# Patient Record
Sex: Female | Born: 1945 | Race: White | Hispanic: No | Marital: Married | Smoking: Never smoker
Health system: Southern US, Community
[De-identification: ages and names within clinical notes are randomized; demographics above are authoritative.]

## PROBLEM LIST (undated history)

## (undated) DIAGNOSIS — C50412 Malignant neoplasm of upper-outer quadrant of left female breast: Secondary | ICD-10-CM

## (undated) DIAGNOSIS — R011 Cardiac murmur, unspecified: Secondary | ICD-10-CM

## (undated) DIAGNOSIS — I10 Essential (primary) hypertension: Secondary | ICD-10-CM

## (undated) DIAGNOSIS — D499 Neoplasm of unspecified behavior of unspecified site: Secondary | ICD-10-CM

## (undated) DIAGNOSIS — C50919 Malignant neoplasm of unspecified site of unspecified female breast: Secondary | ICD-10-CM

## (undated) DIAGNOSIS — R002 Palpitations: Secondary | ICD-10-CM

## (undated) DIAGNOSIS — Z170 Estrogen receptor positive status [ER+]: Secondary | ICD-10-CM

## (undated) HISTORY — PX: CHOLECYSTECTOMY: SHX55

## (undated) HISTORY — DX: Palpitations: R00.2

## (undated) HISTORY — DX: Estrogen receptor positive status (ER+): Z17.0

## (undated) HISTORY — DX: Neoplasm of unspecified behavior of unspecified site: D49.9

## (undated) HISTORY — DX: Malignant neoplasm of unspecified site of unspecified female breast: C50.919

## (undated) HISTORY — PX: TONSILLECTOMY: SUR1361

## (undated) HISTORY — PX: ABDOMINAL HYSTERECTOMY: SHX81

## (undated) HISTORY — DX: Essential (primary) hypertension: I10

## (undated) HISTORY — PX: THYROID SURGERY: SHX805

## (undated) HISTORY — PX: APPENDECTOMY: SHX54

## (undated) HISTORY — DX: Malignant neoplasm of upper-outer quadrant of left female breast: C50.412

## (undated) HISTORY — DX: Cardiac murmur, unspecified: R01.1

---

## 1999-09-22 ENCOUNTER — Encounter: Payer: Self-pay | Admitting: General Surgery

## 1999-09-22 ENCOUNTER — Encounter: Admission: RE | Admit: 1999-09-22 | Discharge: 1999-09-22 | Payer: Self-pay | Admitting: General Surgery

## 2000-11-01 ENCOUNTER — Encounter: Payer: Self-pay | Admitting: General Surgery

## 2000-11-01 ENCOUNTER — Encounter: Admission: RE | Admit: 2000-11-01 | Discharge: 2000-11-01 | Payer: Self-pay | Admitting: General Surgery

## 2002-11-03 ENCOUNTER — Encounter: Admission: RE | Admit: 2002-11-03 | Discharge: 2002-11-03 | Payer: Self-pay | Admitting: Family Medicine

## 2002-11-03 ENCOUNTER — Encounter: Payer: Self-pay | Admitting: *Deleted

## 2004-11-12 ENCOUNTER — Encounter: Admission: RE | Admit: 2004-11-12 | Discharge: 2004-11-12 | Payer: Self-pay | Admitting: Family Medicine

## 2005-12-29 ENCOUNTER — Encounter: Admission: RE | Admit: 2005-12-29 | Discharge: 2005-12-29 | Payer: Self-pay | Admitting: Family Medicine

## 2008-08-16 ENCOUNTER — Encounter: Admission: RE | Admit: 2008-08-16 | Discharge: 2008-08-16 | Payer: Self-pay | Admitting: Family Medicine

## 2009-08-22 ENCOUNTER — Encounter: Admission: RE | Admit: 2009-08-22 | Discharge: 2009-08-22 | Payer: Self-pay | Admitting: Family Medicine

## 2009-12-26 ENCOUNTER — Ambulatory Visit (HOSPITAL_BASED_OUTPATIENT_CLINIC_OR_DEPARTMENT_OTHER): Admission: RE | Admit: 2009-12-26 | Discharge: 2009-12-26 | Payer: Self-pay | Admitting: Orthopedic Surgery

## 2010-08-07 ENCOUNTER — Other Ambulatory Visit: Payer: Self-pay | Admitting: Family Medicine

## 2010-08-07 DIAGNOSIS — Z1231 Encounter for screening mammogram for malignant neoplasm of breast: Secondary | ICD-10-CM

## 2010-08-23 LAB — BASIC METABOLIC PANEL
Calcium: 9.2 mg/dL (ref 8.4–10.5)
Creatinine, Ser: 0.88 mg/dL (ref 0.4–1.2)
GFR calc Af Amer: >60 mL/min (ref >60)
GFR calc non Af Amer: >60 mL/min (ref >60)
Glucose, Bld: 122 mg/dL — ABNORMAL HIGH (ref 70–99)
Sodium: 135 mEq/L (ref 135–145)

## 2010-08-28 ENCOUNTER — Ambulatory Visit: Payer: Self-pay

## 2010-09-11 ENCOUNTER — Ambulatory Visit
Admission: RE | Admit: 2010-09-11 | Discharge: 2010-09-11 | Disposition: A | Discharge status: Home / Self Care | Payer: Medicare Other | Source: Ambulatory Visit | Attending: Family Medicine | Admitting: Family Medicine

## 2010-09-11 DIAGNOSIS — Z1231 Encounter for screening mammogram for malignant neoplasm of breast: Secondary | ICD-10-CM

## 2011-07-06 DIAGNOSIS — L259 Unspecified contact dermatitis, unspecified cause: Secondary | ICD-10-CM | POA: Diagnosis not present

## 2011-09-10 ENCOUNTER — Other Ambulatory Visit: Payer: Self-pay | Admitting: Family Medicine

## 2011-09-10 DIAGNOSIS — H43819 Vitreous degeneration, unspecified eye: Secondary | ICD-10-CM | POA: Diagnosis not present

## 2011-09-10 DIAGNOSIS — H2510 Age-related nuclear cataract, unspecified eye: Secondary | ICD-10-CM | POA: Diagnosis not present

## 2011-09-10 DIAGNOSIS — Z1231 Encounter for screening mammogram for malignant neoplasm of breast: Secondary | ICD-10-CM

## 2011-09-10 DIAGNOSIS — H35039 Hypertensive retinopathy, unspecified eye: Secondary | ICD-10-CM | POA: Diagnosis not present

## 2011-09-10 DIAGNOSIS — H40019 Open angle with borderline findings, low risk, unspecified eye: Secondary | ICD-10-CM | POA: Diagnosis not present

## 2011-09-10 DIAGNOSIS — H04129 Dry eye syndrome of unspecified lacrimal gland: Secondary | ICD-10-CM | POA: Diagnosis not present

## 2011-10-02 ENCOUNTER — Ambulatory Visit: Payer: BC Managed Care – PPO

## 2011-10-16 ENCOUNTER — Ambulatory Visit
Admission: RE | Admit: 2011-10-16 | Discharge: 2011-10-16 | Disposition: A | Discharge status: Home / Self Care | Payer: Medicare Other | Source: Ambulatory Visit | Attending: Family Medicine | Admitting: Family Medicine

## 2011-10-16 DIAGNOSIS — Z1231 Encounter for screening mammogram for malignant neoplasm of breast: Secondary | ICD-10-CM | POA: Diagnosis not present

## 2011-11-17 DIAGNOSIS — E559 Vitamin D deficiency, unspecified: Secondary | ICD-10-CM | POA: Diagnosis not present

## 2011-11-17 DIAGNOSIS — E039 Hypothyroidism, unspecified: Secondary | ICD-10-CM | POA: Diagnosis not present

## 2011-11-17 DIAGNOSIS — E7800 Pure hypercholesterolemia, unspecified: Secondary | ICD-10-CM | POA: Diagnosis not present

## 2011-11-17 DIAGNOSIS — I10 Essential (primary) hypertension: Secondary | ICD-10-CM | POA: Diagnosis not present

## 2011-11-17 DIAGNOSIS — E119 Type 2 diabetes mellitus without complications: Secondary | ICD-10-CM | POA: Diagnosis not present

## 2011-12-24 DIAGNOSIS — E782 Mixed hyperlipidemia: Secondary | ICD-10-CM | POA: Diagnosis not present

## 2012-01-01 DIAGNOSIS — J309 Allergic rhinitis, unspecified: Secondary | ICD-10-CM | POA: Diagnosis not present

## 2012-01-01 DIAGNOSIS — J040 Acute laryngitis: Secondary | ICD-10-CM | POA: Diagnosis not present

## 2012-01-01 DIAGNOSIS — J209 Acute bronchitis, unspecified: Secondary | ICD-10-CM | POA: Diagnosis not present

## 2012-03-01 DIAGNOSIS — R05 Cough: Secondary | ICD-10-CM | POA: Diagnosis not present

## 2012-03-01 DIAGNOSIS — R1012 Left upper quadrant pain: Secondary | ICD-10-CM | POA: Diagnosis not present

## 2012-03-01 DIAGNOSIS — R1013 Epigastric pain: Secondary | ICD-10-CM | POA: Diagnosis not present

## 2012-03-01 DIAGNOSIS — N3000 Acute cystitis without hematuria: Secondary | ICD-10-CM | POA: Diagnosis not present

## 2012-03-21 DIAGNOSIS — Z23 Encounter for immunization: Secondary | ICD-10-CM | POA: Diagnosis not present

## 2012-04-12 DIAGNOSIS — K589 Irritable bowel syndrome without diarrhea: Secondary | ICD-10-CM | POA: Diagnosis not present

## 2012-04-12 DIAGNOSIS — Z86010 Personal history of colonic polyps: Secondary | ICD-10-CM | POA: Diagnosis not present

## 2012-04-12 DIAGNOSIS — Z1211 Encounter for screening for malignant neoplasm of colon: Secondary | ICD-10-CM | POA: Diagnosis not present

## 2012-05-02 DIAGNOSIS — I10 Essential (primary) hypertension: Secondary | ICD-10-CM | POA: Diagnosis not present

## 2012-05-02 DIAGNOSIS — E7800 Pure hypercholesterolemia, unspecified: Secondary | ICD-10-CM | POA: Diagnosis not present

## 2012-05-02 DIAGNOSIS — D126 Benign neoplasm of colon, unspecified: Secondary | ICD-10-CM | POA: Diagnosis not present

## 2012-05-02 DIAGNOSIS — R5381 Other malaise: Secondary | ICD-10-CM | POA: Diagnosis not present

## 2012-05-02 DIAGNOSIS — R197 Diarrhea, unspecified: Secondary | ICD-10-CM | POA: Diagnosis not present

## 2012-05-02 DIAGNOSIS — R404 Transient alteration of awareness: Secondary | ICD-10-CM | POA: Diagnosis not present

## 2012-05-02 DIAGNOSIS — R112 Nausea with vomiting, unspecified: Secondary | ICD-10-CM | POA: Diagnosis not present

## 2012-05-02 DIAGNOSIS — R1084 Generalized abdominal pain: Secondary | ICD-10-CM | POA: Diagnosis not present

## 2012-05-02 DIAGNOSIS — Z86010 Personal history of colonic polyps: Secondary | ICD-10-CM | POA: Diagnosis not present

## 2012-05-02 DIAGNOSIS — R55 Syncope and collapse: Secondary | ICD-10-CM | POA: Diagnosis not present

## 2012-05-02 DIAGNOSIS — K648 Other hemorrhoids: Secondary | ICD-10-CM | POA: Diagnosis not present

## 2012-05-02 DIAGNOSIS — Z1211 Encounter for screening for malignant neoplasm of colon: Secondary | ICD-10-CM | POA: Diagnosis not present

## 2012-05-02 DIAGNOSIS — K5730 Diverticulosis of large intestine without perforation or abscess without bleeding: Secondary | ICD-10-CM | POA: Diagnosis not present

## 2012-05-02 DIAGNOSIS — Z79899 Other long term (current) drug therapy: Secondary | ICD-10-CM | POA: Diagnosis not present

## 2012-05-02 DIAGNOSIS — K589 Irritable bowel syndrome without diarrhea: Secondary | ICD-10-CM | POA: Diagnosis not present

## 2012-06-13 IMAGING — MG MM DIGITAL SCREENING BILAT W/ CAD
5 series · 5 of 5 positions shown · non-contrast
Comparison: none

[R CC (1 of 2)]
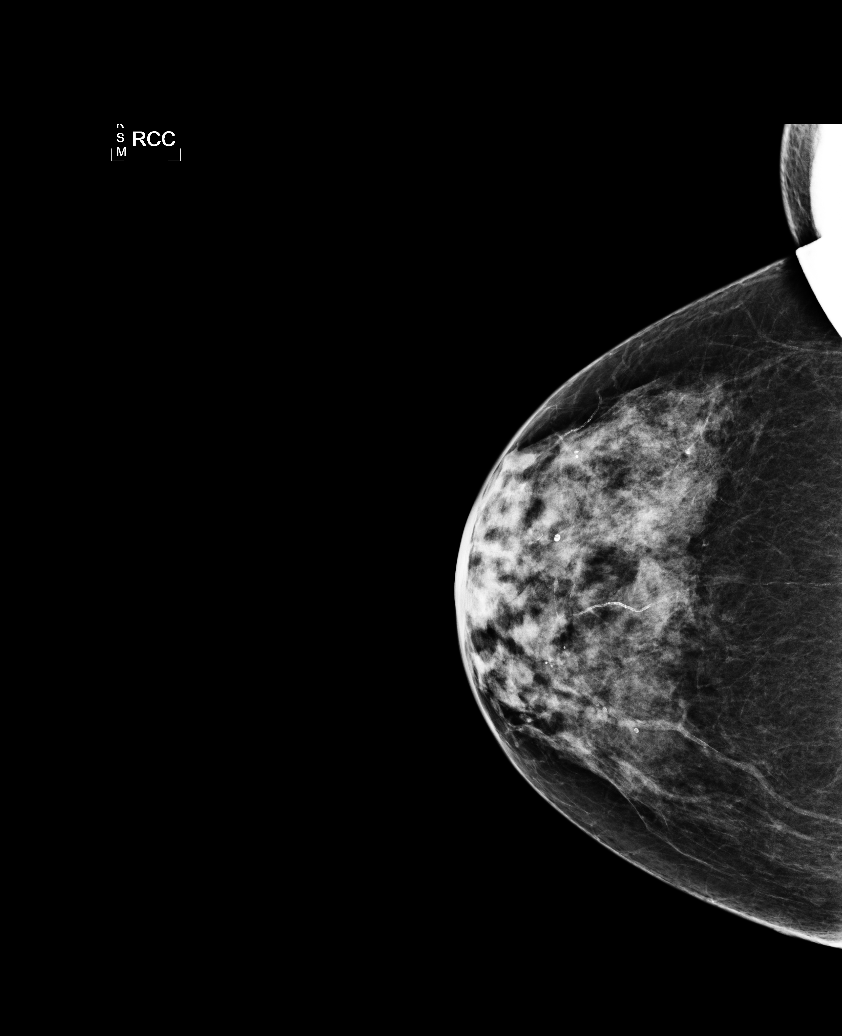

[L CC]
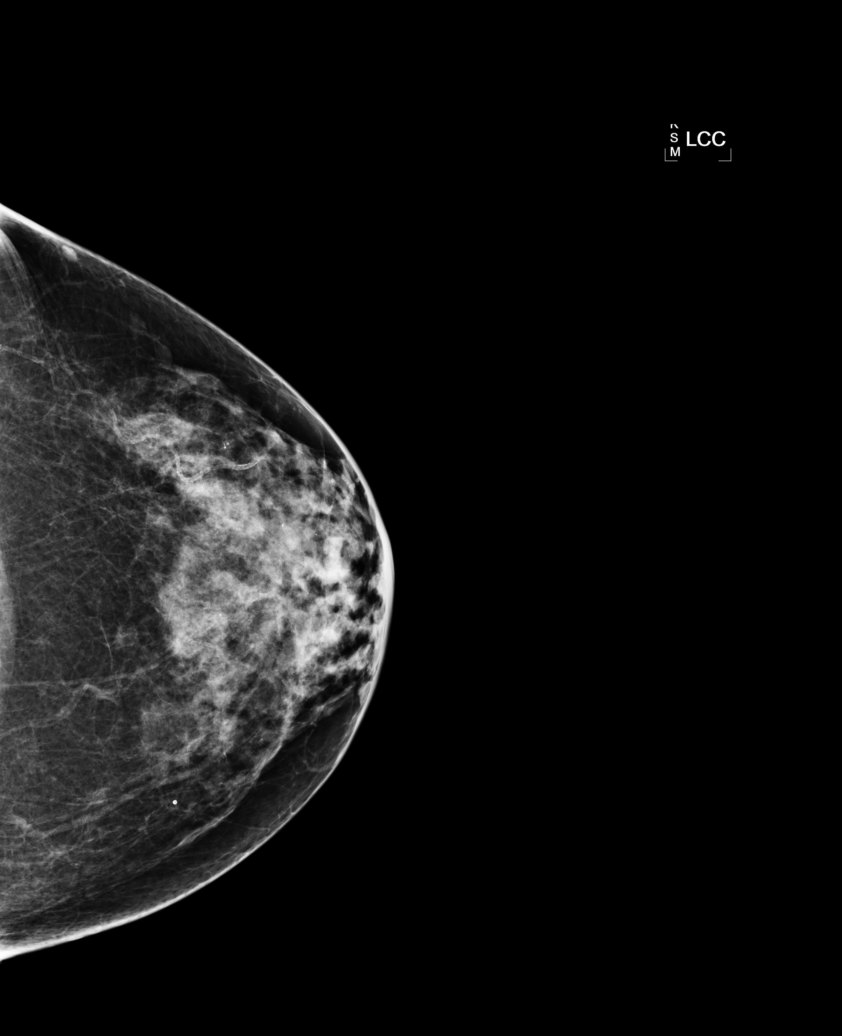

[L MLO]
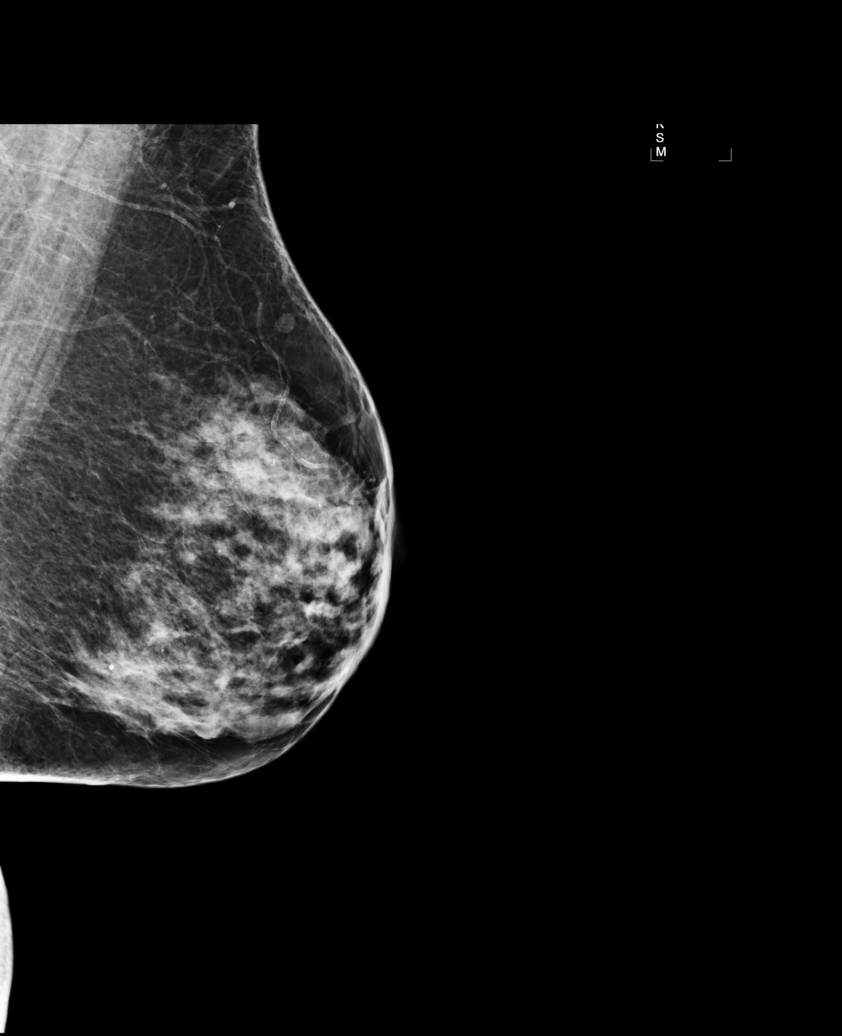

[R MLO]
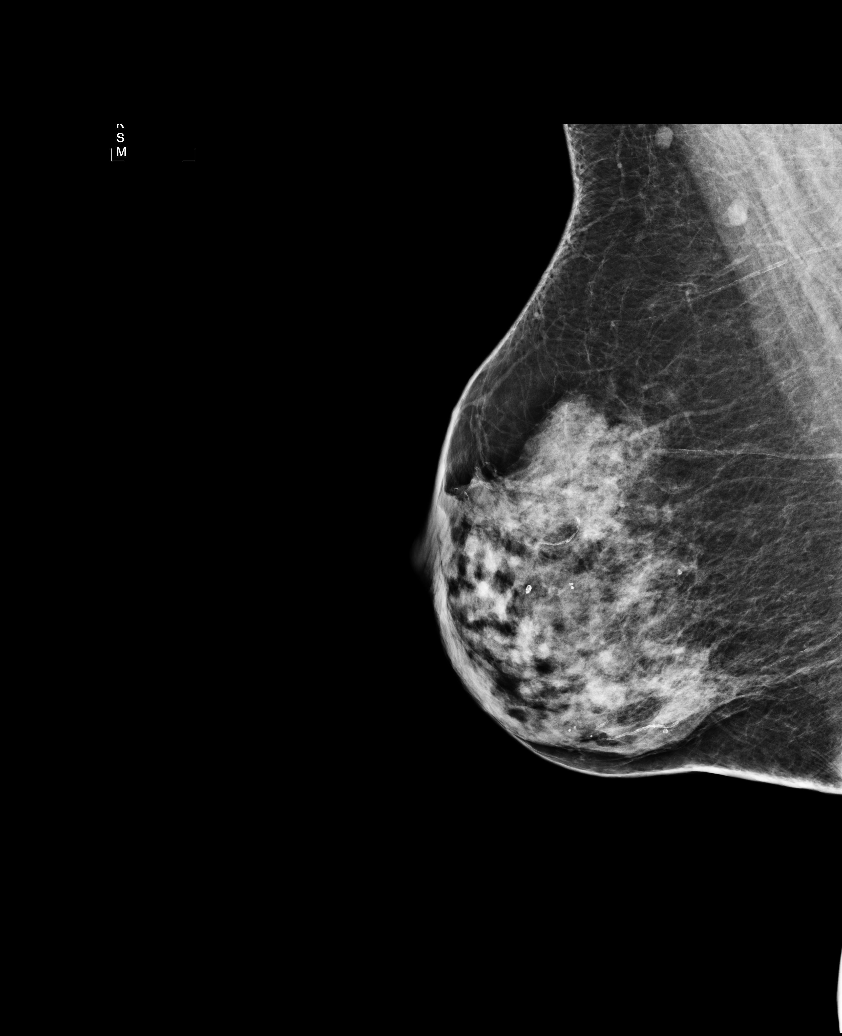

[R CC (2 of 2)]
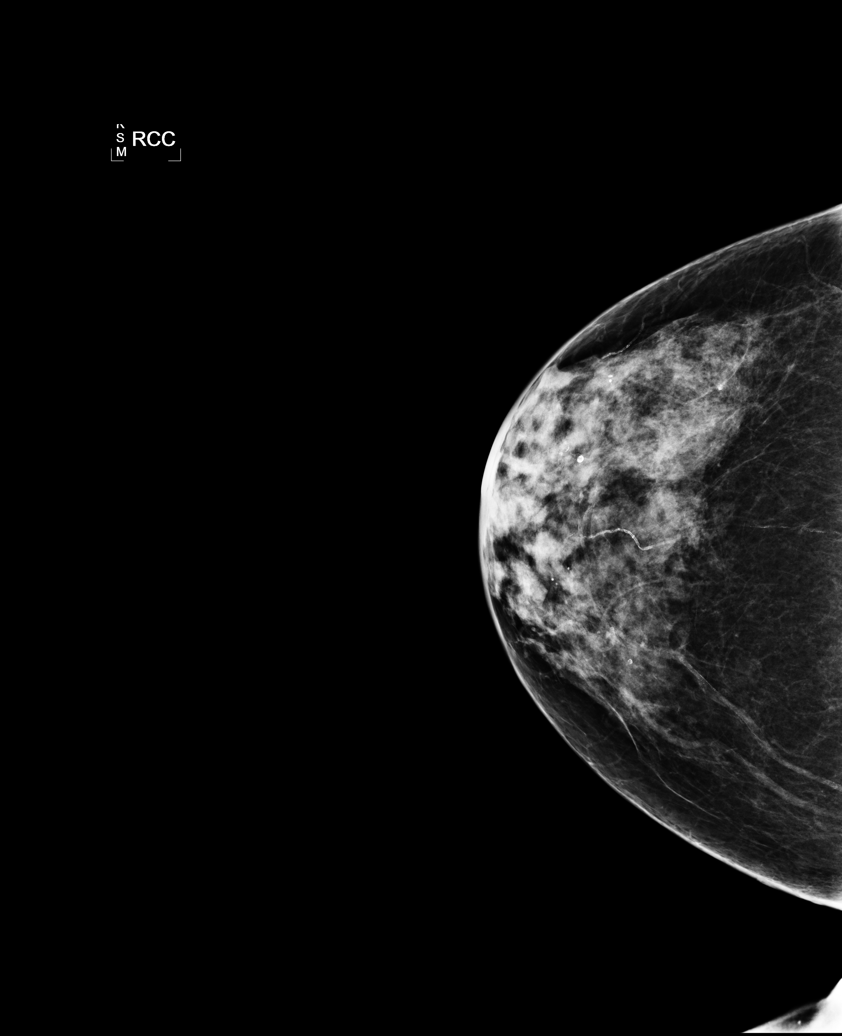

[5 of 5 positions shown; findings below may reference images not displayed]

Canned report from images found in remote index.

Refer to host system for actual result text.

## 2012-11-07 DIAGNOSIS — L578 Other skin changes due to chronic exposure to nonionizing radiation: Secondary | ICD-10-CM | POA: Diagnosis not present

## 2012-11-07 DIAGNOSIS — L820 Inflamed seborrheic keratosis: Secondary | ICD-10-CM | POA: Diagnosis not present

## 2012-11-07 DIAGNOSIS — Z85828 Personal history of other malignant neoplasm of skin: Secondary | ICD-10-CM | POA: Diagnosis not present

## 2012-11-18 DIAGNOSIS — E782 Mixed hyperlipidemia: Secondary | ICD-10-CM | POA: Diagnosis not present

## 2012-11-18 DIAGNOSIS — E119 Type 2 diabetes mellitus without complications: Secondary | ICD-10-CM | POA: Diagnosis not present

## 2012-11-18 DIAGNOSIS — Z0000 Encounter for general adult medical examination without abnormal findings: Secondary | ICD-10-CM | POA: Diagnosis not present

## 2012-11-18 DIAGNOSIS — M949 Disorder of cartilage, unspecified: Secondary | ICD-10-CM | POA: Diagnosis not present

## 2012-11-18 DIAGNOSIS — I10 Essential (primary) hypertension: Secondary | ICD-10-CM | POA: Diagnosis not present

## 2012-11-18 DIAGNOSIS — R82998 Other abnormal findings in urine: Secondary | ICD-10-CM | POA: Diagnosis not present

## 2012-11-18 DIAGNOSIS — M810 Age-related osteoporosis without current pathological fracture: Secondary | ICD-10-CM | POA: Diagnosis not present

## 2012-11-28 ENCOUNTER — Other Ambulatory Visit: Payer: Self-pay

## 2012-11-28 DIAGNOSIS — Z1231 Encounter for screening mammogram for malignant neoplasm of breast: Secondary | ICD-10-CM

## 2012-12-08 DIAGNOSIS — E782 Mixed hyperlipidemia: Secondary | ICD-10-CM | POA: Diagnosis not present

## 2012-12-08 DIAGNOSIS — I10 Essential (primary) hypertension: Secondary | ICD-10-CM | POA: Diagnosis not present

## 2012-12-08 DIAGNOSIS — E119 Type 2 diabetes mellitus without complications: Secondary | ICD-10-CM | POA: Diagnosis not present

## 2012-12-14 ENCOUNTER — Ambulatory Visit: Payer: Medicare Other

## 2012-12-16 DIAGNOSIS — L820 Inflamed seborrheic keratosis: Secondary | ICD-10-CM | POA: Diagnosis not present

## 2012-12-16 DIAGNOSIS — Z85828 Personal history of other malignant neoplasm of skin: Secondary | ICD-10-CM | POA: Diagnosis not present

## 2012-12-21 DIAGNOSIS — L821 Other seborrheic keratosis: Secondary | ICD-10-CM | POA: Diagnosis not present

## 2012-12-21 DIAGNOSIS — D2370 Other benign neoplasm of skin of unspecified lower limb, including hip: Secondary | ICD-10-CM | POA: Diagnosis not present

## 2012-12-21 DIAGNOSIS — Z85828 Personal history of other malignant neoplasm of skin: Secondary | ICD-10-CM | POA: Diagnosis not present

## 2012-12-29 ENCOUNTER — Ambulatory Visit: Payer: Medicare Other

## 2013-01-06 DIAGNOSIS — N3000 Acute cystitis without hematuria: Secondary | ICD-10-CM | POA: Diagnosis not present

## 2013-01-06 DIAGNOSIS — N899 Noninflammatory disorder of vagina, unspecified: Secondary | ICD-10-CM | POA: Diagnosis not present

## 2013-02-27 ENCOUNTER — Ambulatory Visit: Payer: Medicare Other

## 2013-03-14 DIAGNOSIS — Z23 Encounter for immunization: Secondary | ICD-10-CM | POA: Diagnosis not present

## 2013-10-25 ENCOUNTER — Ambulatory Visit: Payer: Medicare Other

## 2013-10-27 ENCOUNTER — Ambulatory Visit
Admission: RE | Admit: 2013-10-27 | Discharge: 2013-10-27 | Disposition: A | Discharge status: Home / Self Care | Payer: Medicare Other | Source: Ambulatory Visit

## 2013-10-27 DIAGNOSIS — Z1231 Encounter for screening mammogram for malignant neoplasm of breast: Secondary | ICD-10-CM

## 2013-11-22 DIAGNOSIS — Z0000 Encounter for general adult medical examination without abnormal findings: Secondary | ICD-10-CM | POA: Diagnosis not present

## 2013-11-22 DIAGNOSIS — K219 Gastro-esophageal reflux disease without esophagitis: Secondary | ICD-10-CM | POA: Diagnosis not present

## 2013-11-22 DIAGNOSIS — E559 Vitamin D deficiency, unspecified: Secondary | ICD-10-CM | POA: Diagnosis not present

## 2013-11-22 DIAGNOSIS — I10 Essential (primary) hypertension: Secondary | ICD-10-CM | POA: Diagnosis not present

## 2013-11-22 DIAGNOSIS — E119 Type 2 diabetes mellitus without complications: Secondary | ICD-10-CM | POA: Diagnosis not present

## 2013-11-22 DIAGNOSIS — E782 Mixed hyperlipidemia: Secondary | ICD-10-CM | POA: Diagnosis not present

## 2013-11-22 DIAGNOSIS — E039 Hypothyroidism, unspecified: Secondary | ICD-10-CM | POA: Diagnosis not present

## 2013-11-28 DIAGNOSIS — Z124 Encounter for screening for malignant neoplasm of cervix: Secondary | ICD-10-CM | POA: Diagnosis not present

## 2013-12-18 DIAGNOSIS — E119 Type 2 diabetes mellitus without complications: Secondary | ICD-10-CM | POA: Diagnosis not present

## 2013-12-21 DIAGNOSIS — D1801 Hemangioma of skin and subcutaneous tissue: Secondary | ICD-10-CM | POA: Diagnosis not present

## 2013-12-21 DIAGNOSIS — D235 Other benign neoplasm of skin of trunk: Secondary | ICD-10-CM | POA: Diagnosis not present

## 2013-12-21 DIAGNOSIS — L259 Unspecified contact dermatitis, unspecified cause: Secondary | ICD-10-CM | POA: Diagnosis not present

## 2013-12-21 DIAGNOSIS — L821 Other seborrheic keratosis: Secondary | ICD-10-CM | POA: Diagnosis not present

## 2013-12-21 DIAGNOSIS — Z85828 Personal history of other malignant neoplasm of skin: Secondary | ICD-10-CM | POA: Diagnosis not present

## 2013-12-21 DIAGNOSIS — D1739 Benign lipomatous neoplasm of skin and subcutaneous tissue of other sites: Secondary | ICD-10-CM | POA: Diagnosis not present

## 2013-12-25 DIAGNOSIS — E119 Type 2 diabetes mellitus without complications: Secondary | ICD-10-CM | POA: Diagnosis not present

## 2014-01-25 DIAGNOSIS — E119 Type 2 diabetes mellitus without complications: Secondary | ICD-10-CM | POA: Diagnosis not present

## 2014-03-05 DIAGNOSIS — Z23 Encounter for immunization: Secondary | ICD-10-CM | POA: Diagnosis not present

## 2014-04-16 DIAGNOSIS — H01009 Unspecified blepharitis unspecified eye, unspecified eyelid: Secondary | ICD-10-CM | POA: Diagnosis not present

## 2014-05-16 DIAGNOSIS — E559 Vitamin D deficiency, unspecified: Secondary | ICD-10-CM | POA: Diagnosis not present

## 2014-05-16 DIAGNOSIS — I10 Essential (primary) hypertension: Secondary | ICD-10-CM | POA: Diagnosis not present

## 2014-05-16 DIAGNOSIS — E782 Mixed hyperlipidemia: Secondary | ICD-10-CM | POA: Diagnosis not present

## 2014-05-16 DIAGNOSIS — E1165 Type 2 diabetes mellitus with hyperglycemia: Secondary | ICD-10-CM | POA: Diagnosis not present

## 2014-05-16 DIAGNOSIS — M1990 Unspecified osteoarthritis, unspecified site: Secondary | ICD-10-CM | POA: Diagnosis not present

## 2014-06-06 DIAGNOSIS — E871 Hypo-osmolality and hyponatremia: Secondary | ICD-10-CM | POA: Diagnosis not present

## 2014-07-10 DIAGNOSIS — J40 Bronchitis, not specified as acute or chronic: Secondary | ICD-10-CM | POA: Diagnosis not present

## 2014-07-19 DIAGNOSIS — J40 Bronchitis, not specified as acute or chronic: Secondary | ICD-10-CM | POA: Diagnosis not present

## 2014-08-17 DIAGNOSIS — E1165 Type 2 diabetes mellitus with hyperglycemia: Secondary | ICD-10-CM | POA: Diagnosis not present

## 2014-08-17 DIAGNOSIS — E782 Mixed hyperlipidemia: Secondary | ICD-10-CM | POA: Diagnosis not present

## 2014-08-17 DIAGNOSIS — R636 Underweight: Secondary | ICD-10-CM | POA: Diagnosis not present

## 2014-08-17 DIAGNOSIS — J40 Bronchitis, not specified as acute or chronic: Secondary | ICD-10-CM | POA: Diagnosis not present

## 2014-09-27 DIAGNOSIS — K1120 Sialoadenitis, unspecified: Secondary | ICD-10-CM | POA: Diagnosis not present

## 2014-10-15 DIAGNOSIS — J029 Acute pharyngitis, unspecified: Secondary | ICD-10-CM | POA: Diagnosis not present

## 2014-10-30 ENCOUNTER — Other Ambulatory Visit: Payer: Self-pay

## 2014-10-30 DIAGNOSIS — Z1231 Encounter for screening mammogram for malignant neoplasm of breast: Secondary | ICD-10-CM

## 2014-11-23 ENCOUNTER — Other Ambulatory Visit: Payer: Self-pay | Admitting: Family Medicine

## 2014-11-23 ENCOUNTER — Ambulatory Visit
Admission: RE | Admit: 2014-11-23 | Discharge: 2014-11-23 | Disposition: A | Discharge status: Home / Self Care | Payer: Medicare Other | Source: Ambulatory Visit

## 2014-11-23 DIAGNOSIS — N63 Unspecified lump in unspecified breast: Secondary | ICD-10-CM

## 2014-11-23 DIAGNOSIS — Z1231 Encounter for screening mammogram for malignant neoplasm of breast: Secondary | ICD-10-CM

## 2014-11-29 DIAGNOSIS — Z85828 Personal history of other malignant neoplasm of skin: Secondary | ICD-10-CM | POA: Diagnosis not present

## 2014-11-29 DIAGNOSIS — L919 Hypertrophic disorder of the skin, unspecified: Secondary | ICD-10-CM | POA: Diagnosis not present

## 2014-12-27 DIAGNOSIS — K5790 Diverticulosis of intestine, part unspecified, without perforation or abscess without bleeding: Secondary | ICD-10-CM | POA: Diagnosis not present

## 2014-12-27 DIAGNOSIS — Z79899 Other long term (current) drug therapy: Secondary | ICD-10-CM | POA: Diagnosis not present

## 2014-12-27 DIAGNOSIS — E782 Mixed hyperlipidemia: Secondary | ICD-10-CM | POA: Diagnosis not present

## 2014-12-27 DIAGNOSIS — Z85828 Personal history of other malignant neoplasm of skin: Secondary | ICD-10-CM | POA: Diagnosis not present

## 2014-12-27 DIAGNOSIS — E1165 Type 2 diabetes mellitus with hyperglycemia: Secondary | ICD-10-CM | POA: Diagnosis not present

## 2014-12-27 DIAGNOSIS — Z23 Encounter for immunization: Secondary | ICD-10-CM | POA: Diagnosis not present

## 2014-12-27 DIAGNOSIS — N3001 Acute cystitis with hematuria: Secondary | ICD-10-CM | POA: Diagnosis not present

## 2014-12-27 DIAGNOSIS — Z13820 Encounter for screening for osteoporosis: Secondary | ICD-10-CM | POA: Diagnosis not present

## 2014-12-27 DIAGNOSIS — Z0000 Encounter for general adult medical examination without abnormal findings: Secondary | ICD-10-CM | POA: Diagnosis not present

## 2015-01-02 DIAGNOSIS — R928 Other abnormal and inconclusive findings on diagnostic imaging of breast: Secondary | ICD-10-CM | POA: Diagnosis not present

## 2015-01-02 DIAGNOSIS — N63 Unspecified lump in breast: Secondary | ICD-10-CM | POA: Diagnosis not present

## 2015-01-07 DIAGNOSIS — N63 Unspecified lump in breast: Secondary | ICD-10-CM | POA: Diagnosis not present

## 2015-01-07 DIAGNOSIS — C50412 Malignant neoplasm of upper-outer quadrant of left female breast: Secondary | ICD-10-CM | POA: Diagnosis not present

## 2015-01-07 DIAGNOSIS — C50912 Malignant neoplasm of unspecified site of left female breast: Secondary | ICD-10-CM | POA: Diagnosis not present

## 2015-01-12 DIAGNOSIS — N3001 Acute cystitis with hematuria: Secondary | ICD-10-CM | POA: Diagnosis not present

## 2015-01-12 DIAGNOSIS — N3090 Cystitis, unspecified without hematuria: Secondary | ICD-10-CM | POA: Diagnosis not present

## 2015-01-14 DIAGNOSIS — Z170 Estrogen receptor positive status [ER+]: Secondary | ICD-10-CM | POA: Diagnosis not present

## 2015-01-14 DIAGNOSIS — C50412 Malignant neoplasm of upper-outer quadrant of left female breast: Secondary | ICD-10-CM | POA: Diagnosis not present

## 2015-01-29 DIAGNOSIS — Z01812 Encounter for preprocedural laboratory examination: Secondary | ICD-10-CM | POA: Diagnosis not present

## 2015-01-29 DIAGNOSIS — C50912 Malignant neoplasm of unspecified site of left female breast: Secondary | ICD-10-CM | POA: Diagnosis not present

## 2015-01-29 DIAGNOSIS — R8290 Unspecified abnormal findings in urine: Secondary | ICD-10-CM | POA: Diagnosis not present

## 2015-01-30 DIAGNOSIS — C50412 Malignant neoplasm of upper-outer quadrant of left female breast: Secondary | ICD-10-CM | POA: Diagnosis not present

## 2015-01-30 DIAGNOSIS — I10 Essential (primary) hypertension: Secondary | ICD-10-CM | POA: Diagnosis not present

## 2015-01-30 DIAGNOSIS — Z853 Personal history of malignant neoplasm of breast: Secondary | ICD-10-CM

## 2015-01-30 DIAGNOSIS — E119 Type 2 diabetes mellitus without complications: Secondary | ICD-10-CM | POA: Diagnosis not present

## 2015-01-30 DIAGNOSIS — C3490 Malignant neoplasm of unspecified part of unspecified bronchus or lung: Secondary | ICD-10-CM | POA: Diagnosis not present

## 2015-01-30 DIAGNOSIS — Z170 Estrogen receptor positive status [ER+]: Secondary | ICD-10-CM | POA: Diagnosis not present

## 2015-01-30 DIAGNOSIS — Z85828 Personal history of other malignant neoplasm of skin: Secondary | ICD-10-CM | POA: Diagnosis not present

## 2015-01-30 DIAGNOSIS — N63 Unspecified lump in breast: Secondary | ICD-10-CM | POA: Diagnosis not present

## 2015-01-30 DIAGNOSIS — Z79899 Other long term (current) drug therapy: Secondary | ICD-10-CM | POA: Diagnosis not present

## 2015-01-30 DIAGNOSIS — E785 Hyperlipidemia, unspecified: Secondary | ICD-10-CM | POA: Diagnosis not present

## 2015-01-30 DIAGNOSIS — C50912 Malignant neoplasm of unspecified site of left female breast: Secondary | ICD-10-CM | POA: Diagnosis not present

## 2015-01-30 HISTORY — PX: BREAST LUMPECTOMY: SHX2

## 2015-01-30 HISTORY — DX: Personal history of malignant neoplasm of breast: Z85.3

## 2015-02-20 DIAGNOSIS — N6092 Unspecified benign mammary dysplasia of left breast: Secondary | ICD-10-CM | POA: Diagnosis not present

## 2015-02-20 DIAGNOSIS — Z79899 Other long term (current) drug therapy: Secondary | ICD-10-CM | POA: Diagnosis not present

## 2015-02-20 DIAGNOSIS — N6012 Diffuse cystic mastopathy of left breast: Secondary | ICD-10-CM | POA: Diagnosis not present

## 2015-02-20 DIAGNOSIS — J301 Allergic rhinitis due to pollen: Secondary | ICD-10-CM | POA: Diagnosis not present

## 2015-02-20 DIAGNOSIS — C50912 Malignant neoplasm of unspecified site of left female breast: Secondary | ICD-10-CM | POA: Diagnosis not present

## 2015-02-20 DIAGNOSIS — N6042 Mammary duct ectasia of left breast: Secondary | ICD-10-CM | POA: Diagnosis not present

## 2015-02-20 DIAGNOSIS — I10 Essential (primary) hypertension: Secondary | ICD-10-CM | POA: Diagnosis not present

## 2015-02-20 DIAGNOSIS — C50412 Malignant neoplasm of upper-outer quadrant of left female breast: Secondary | ICD-10-CM | POA: Diagnosis not present

## 2015-02-20 DIAGNOSIS — N641 Fat necrosis of breast: Secondary | ICD-10-CM | POA: Diagnosis not present

## 2015-02-20 DIAGNOSIS — E119 Type 2 diabetes mellitus without complications: Secondary | ICD-10-CM | POA: Diagnosis not present

## 2015-02-20 DIAGNOSIS — N6022 Fibroadenosis of left breast: Secondary | ICD-10-CM | POA: Diagnosis not present

## 2015-02-28 DIAGNOSIS — Z23 Encounter for immunization: Secondary | ICD-10-CM | POA: Diagnosis not present

## 2015-03-06 DIAGNOSIS — R300 Dysuria: Secondary | ICD-10-CM | POA: Diagnosis not present

## 2015-03-06 DIAGNOSIS — C50412 Malignant neoplasm of upper-outer quadrant of left female breast: Secondary | ICD-10-CM | POA: Diagnosis not present

## 2015-03-14 DIAGNOSIS — Z510 Encounter for antineoplastic radiation therapy: Secondary | ICD-10-CM | POA: Diagnosis not present

## 2015-03-14 DIAGNOSIS — C50412 Malignant neoplasm of upper-outer quadrant of left female breast: Secondary | ICD-10-CM | POA: Diagnosis not present

## 2015-03-15 DIAGNOSIS — C50412 Malignant neoplasm of upper-outer quadrant of left female breast: Secondary | ICD-10-CM | POA: Diagnosis not present

## 2015-03-18 DIAGNOSIS — C50412 Malignant neoplasm of upper-outer quadrant of left female breast: Secondary | ICD-10-CM | POA: Diagnosis not present

## 2015-03-18 DIAGNOSIS — Z510 Encounter for antineoplastic radiation therapy: Secondary | ICD-10-CM | POA: Diagnosis not present

## 2015-03-22 DIAGNOSIS — C50412 Malignant neoplasm of upper-outer quadrant of left female breast: Secondary | ICD-10-CM | POA: Diagnosis not present

## 2015-03-22 DIAGNOSIS — Z510 Encounter for antineoplastic radiation therapy: Secondary | ICD-10-CM | POA: Diagnosis not present

## 2015-03-25 DIAGNOSIS — Z510 Encounter for antineoplastic radiation therapy: Secondary | ICD-10-CM | POA: Diagnosis not present

## 2015-03-25 DIAGNOSIS — C50412 Malignant neoplasm of upper-outer quadrant of left female breast: Secondary | ICD-10-CM | POA: Diagnosis not present

## 2015-03-26 DIAGNOSIS — Z510 Encounter for antineoplastic radiation therapy: Secondary | ICD-10-CM | POA: Diagnosis not present

## 2015-03-26 DIAGNOSIS — C50412 Malignant neoplasm of upper-outer quadrant of left female breast: Secondary | ICD-10-CM | POA: Diagnosis not present

## 2015-03-27 DIAGNOSIS — Z510 Encounter for antineoplastic radiation therapy: Secondary | ICD-10-CM | POA: Diagnosis not present

## 2015-03-27 DIAGNOSIS — C50412 Malignant neoplasm of upper-outer quadrant of left female breast: Secondary | ICD-10-CM | POA: Diagnosis not present

## 2015-03-28 DIAGNOSIS — C50412 Malignant neoplasm of upper-outer quadrant of left female breast: Secondary | ICD-10-CM | POA: Diagnosis not present

## 2015-03-28 DIAGNOSIS — Z510 Encounter for antineoplastic radiation therapy: Secondary | ICD-10-CM | POA: Diagnosis not present

## 2015-03-29 DIAGNOSIS — Z510 Encounter for antineoplastic radiation therapy: Secondary | ICD-10-CM | POA: Diagnosis not present

## 2015-03-29 DIAGNOSIS — C50412 Malignant neoplasm of upper-outer quadrant of left female breast: Secondary | ICD-10-CM | POA: Diagnosis not present

## 2015-04-01 DIAGNOSIS — Z510 Encounter for antineoplastic radiation therapy: Secondary | ICD-10-CM | POA: Diagnosis not present

## 2015-04-01 DIAGNOSIS — C50412 Malignant neoplasm of upper-outer quadrant of left female breast: Secondary | ICD-10-CM | POA: Diagnosis not present

## 2015-04-02 DIAGNOSIS — C50412 Malignant neoplasm of upper-outer quadrant of left female breast: Secondary | ICD-10-CM | POA: Diagnosis not present

## 2015-04-02 DIAGNOSIS — Z510 Encounter for antineoplastic radiation therapy: Secondary | ICD-10-CM | POA: Diagnosis not present

## 2015-04-03 DIAGNOSIS — C50412 Malignant neoplasm of upper-outer quadrant of left female breast: Secondary | ICD-10-CM | POA: Diagnosis not present

## 2015-04-03 DIAGNOSIS — Z510 Encounter for antineoplastic radiation therapy: Secondary | ICD-10-CM | POA: Diagnosis not present

## 2015-04-05 DIAGNOSIS — Z510 Encounter for antineoplastic radiation therapy: Secondary | ICD-10-CM | POA: Diagnosis not present

## 2015-04-05 DIAGNOSIS — C50412 Malignant neoplasm of upper-outer quadrant of left female breast: Secondary | ICD-10-CM | POA: Diagnosis not present

## 2015-04-08 DIAGNOSIS — Z510 Encounter for antineoplastic radiation therapy: Secondary | ICD-10-CM | POA: Diagnosis not present

## 2015-04-08 DIAGNOSIS — C50412 Malignant neoplasm of upper-outer quadrant of left female breast: Secondary | ICD-10-CM | POA: Diagnosis not present

## 2015-04-09 DIAGNOSIS — C50412 Malignant neoplasm of upper-outer quadrant of left female breast: Secondary | ICD-10-CM | POA: Diagnosis not present

## 2015-04-09 DIAGNOSIS — Z510 Encounter for antineoplastic radiation therapy: Secondary | ICD-10-CM | POA: Diagnosis not present

## 2015-04-10 DIAGNOSIS — C50412 Malignant neoplasm of upper-outer quadrant of left female breast: Secondary | ICD-10-CM | POA: Diagnosis not present

## 2015-04-10 DIAGNOSIS — Z510 Encounter for antineoplastic radiation therapy: Secondary | ICD-10-CM | POA: Diagnosis not present

## 2015-04-11 DIAGNOSIS — Z510 Encounter for antineoplastic radiation therapy: Secondary | ICD-10-CM | POA: Diagnosis not present

## 2015-04-11 DIAGNOSIS — C50412 Malignant neoplasm of upper-outer quadrant of left female breast: Secondary | ICD-10-CM | POA: Diagnosis not present

## 2015-04-12 DIAGNOSIS — C50412 Malignant neoplasm of upper-outer quadrant of left female breast: Secondary | ICD-10-CM | POA: Diagnosis not present

## 2015-04-12 DIAGNOSIS — Z510 Encounter for antineoplastic radiation therapy: Secondary | ICD-10-CM | POA: Diagnosis not present

## 2015-04-15 DIAGNOSIS — C50412 Malignant neoplasm of upper-outer quadrant of left female breast: Secondary | ICD-10-CM | POA: Diagnosis not present

## 2015-04-15 DIAGNOSIS — Z510 Encounter for antineoplastic radiation therapy: Secondary | ICD-10-CM | POA: Diagnosis not present

## 2015-04-16 DIAGNOSIS — C50412 Malignant neoplasm of upper-outer quadrant of left female breast: Secondary | ICD-10-CM | POA: Diagnosis not present

## 2015-04-16 DIAGNOSIS — Z510 Encounter for antineoplastic radiation therapy: Secondary | ICD-10-CM | POA: Diagnosis not present

## 2015-04-17 DIAGNOSIS — C50412 Malignant neoplasm of upper-outer quadrant of left female breast: Secondary | ICD-10-CM | POA: Diagnosis not present

## 2015-04-17 DIAGNOSIS — Z510 Encounter for antineoplastic radiation therapy: Secondary | ICD-10-CM | POA: Diagnosis not present

## 2015-04-18 DIAGNOSIS — Z510 Encounter for antineoplastic radiation therapy: Secondary | ICD-10-CM | POA: Diagnosis not present

## 2015-04-18 DIAGNOSIS — C50412 Malignant neoplasm of upper-outer quadrant of left female breast: Secondary | ICD-10-CM | POA: Diagnosis not present

## 2015-04-19 DIAGNOSIS — C50412 Malignant neoplasm of upper-outer quadrant of left female breast: Secondary | ICD-10-CM | POA: Diagnosis not present

## 2015-04-19 DIAGNOSIS — Z510 Encounter for antineoplastic radiation therapy: Secondary | ICD-10-CM | POA: Diagnosis not present

## 2015-04-22 DIAGNOSIS — Z510 Encounter for antineoplastic radiation therapy: Secondary | ICD-10-CM | POA: Diagnosis not present

## 2015-04-22 DIAGNOSIS — C50412 Malignant neoplasm of upper-outer quadrant of left female breast: Secondary | ICD-10-CM | POA: Diagnosis not present

## 2015-05-22 DIAGNOSIS — D0510 Intraductal carcinoma in situ of unspecified breast: Secondary | ICD-10-CM | POA: Diagnosis not present

## 2015-05-22 DIAGNOSIS — C50412 Malignant neoplasm of upper-outer quadrant of left female breast: Secondary | ICD-10-CM | POA: Diagnosis not present

## 2015-08-21 DIAGNOSIS — Z79811 Long term (current) use of aromatase inhibitors: Secondary | ICD-10-CM | POA: Diagnosis not present

## 2015-08-21 DIAGNOSIS — Z853 Personal history of malignant neoplasm of breast: Secondary | ICD-10-CM | POA: Diagnosis not present

## 2015-08-28 DIAGNOSIS — D1801 Hemangioma of skin and subcutaneous tissue: Secondary | ICD-10-CM | POA: Diagnosis not present

## 2015-08-28 DIAGNOSIS — L821 Other seborrheic keratosis: Secondary | ICD-10-CM | POA: Diagnosis not present

## 2015-08-28 DIAGNOSIS — Z85828 Personal history of other malignant neoplasm of skin: Secondary | ICD-10-CM | POA: Diagnosis not present

## 2015-08-28 DIAGNOSIS — D1722 Benign lipomatous neoplasm of skin and subcutaneous tissue of left arm: Secondary | ICD-10-CM | POA: Diagnosis not present

## 2015-08-28 DIAGNOSIS — B078 Other viral warts: Secondary | ICD-10-CM | POA: Diagnosis not present

## 2015-08-28 DIAGNOSIS — C44612 Basal cell carcinoma of skin of right upper limb, including shoulder: Secondary | ICD-10-CM | POA: Diagnosis not present

## 2015-08-30 DIAGNOSIS — C50412 Malignant neoplasm of upper-outer quadrant of left female breast: Secondary | ICD-10-CM | POA: Insufficient documentation

## 2015-08-30 DIAGNOSIS — Z170 Estrogen receptor positive status [ER+]: Secondary | ICD-10-CM

## 2015-08-30 DIAGNOSIS — D499 Neoplasm of unspecified behavior of unspecified site: Secondary | ICD-10-CM | POA: Insufficient documentation

## 2015-08-30 HISTORY — DX: Malignant neoplasm of upper-outer quadrant of left female breast: C50.412

## 2015-08-30 HISTORY — DX: Estrogen receptor positive status (ER+): Z17.0

## 2015-08-30 HISTORY — DX: Neoplasm of unspecified behavior of unspecified site: D49.9

## 2015-12-02 DIAGNOSIS — Z79811 Long term (current) use of aromatase inhibitors: Secondary | ICD-10-CM | POA: Diagnosis not present

## 2015-12-02 DIAGNOSIS — Z853 Personal history of malignant neoplasm of breast: Secondary | ICD-10-CM | POA: Diagnosis not present

## 2015-12-30 DIAGNOSIS — Z0000 Encounter for general adult medical examination without abnormal findings: Secondary | ICD-10-CM | POA: Diagnosis not present

## 2015-12-30 DIAGNOSIS — Z79899 Other long term (current) drug therapy: Secondary | ICD-10-CM | POA: Diagnosis not present

## 2015-12-30 DIAGNOSIS — E782 Mixed hyperlipidemia: Secondary | ICD-10-CM | POA: Diagnosis not present

## 2015-12-30 DIAGNOSIS — I10 Essential (primary) hypertension: Secondary | ICD-10-CM | POA: Diagnosis not present

## 2015-12-30 DIAGNOSIS — E559 Vitamin D deficiency, unspecified: Secondary | ICD-10-CM | POA: Diagnosis not present

## 2015-12-30 DIAGNOSIS — K219 Gastro-esophageal reflux disease without esophagitis: Secondary | ICD-10-CM | POA: Diagnosis not present

## 2015-12-30 DIAGNOSIS — R8271 Bacteriuria: Secondary | ICD-10-CM | POA: Diagnosis not present

## 2015-12-30 DIAGNOSIS — E1165 Type 2 diabetes mellitus with hyperglycemia: Secondary | ICD-10-CM | POA: Diagnosis not present

## 2015-12-30 DIAGNOSIS — M1990 Unspecified osteoarthritis, unspecified site: Secondary | ICD-10-CM | POA: Diagnosis not present

## 2016-01-06 DIAGNOSIS — Z9889 Other specified postprocedural states: Secondary | ICD-10-CM | POA: Diagnosis not present

## 2016-01-06 DIAGNOSIS — C50412 Malignant neoplasm of upper-outer quadrant of left female breast: Secondary | ICD-10-CM | POA: Diagnosis not present

## 2016-01-06 DIAGNOSIS — R928 Other abnormal and inconclusive findings on diagnostic imaging of breast: Secondary | ICD-10-CM | POA: Diagnosis not present

## 2016-01-09 DIAGNOSIS — Z1211 Encounter for screening for malignant neoplasm of colon: Secondary | ICD-10-CM | POA: Diagnosis not present

## 2016-01-26 DIAGNOSIS — N3001 Acute cystitis with hematuria: Secondary | ICD-10-CM | POA: Diagnosis not present

## 2016-01-26 DIAGNOSIS — N3090 Cystitis, unspecified without hematuria: Secondary | ICD-10-CM | POA: Diagnosis not present

## 2016-01-27 DIAGNOSIS — C50412 Malignant neoplasm of upper-outer quadrant of left female breast: Secondary | ICD-10-CM | POA: Diagnosis not present

## 2016-01-27 DIAGNOSIS — D499 Neoplasm of unspecified behavior of unspecified site: Secondary | ICD-10-CM | POA: Diagnosis not present

## 2016-01-27 DIAGNOSIS — Z170 Estrogen receptor positive status [ER+]: Secondary | ICD-10-CM | POA: Diagnosis not present

## 2016-02-11 ENCOUNTER — Other Ambulatory Visit: Payer: Self-pay

## 2016-02-17 DIAGNOSIS — Z23 Encounter for immunization: Secondary | ICD-10-CM | POA: Diagnosis not present

## 2016-02-24 DIAGNOSIS — C50412 Malignant neoplasm of upper-outer quadrant of left female breast: Secondary | ICD-10-CM

## 2016-02-24 DIAGNOSIS — Z853 Personal history of malignant neoplasm of breast: Secondary | ICD-10-CM | POA: Diagnosis not present

## 2016-02-24 DIAGNOSIS — Z79811 Long term (current) use of aromatase inhibitors: Secondary | ICD-10-CM

## 2016-02-24 DIAGNOSIS — Z170 Estrogen receptor positive status [ER+]: Secondary | ICD-10-CM

## 2016-03-09 DIAGNOSIS — N3001 Acute cystitis with hematuria: Secondary | ICD-10-CM | POA: Diagnosis not present

## 2016-03-15 DIAGNOSIS — L508 Other urticaria: Secondary | ICD-10-CM | POA: Diagnosis not present

## 2016-05-05 DIAGNOSIS — R3915 Urgency of urination: Secondary | ICD-10-CM | POA: Diagnosis not present

## 2016-05-05 DIAGNOSIS — R8271 Bacteriuria: Secondary | ICD-10-CM | POA: Diagnosis not present

## 2016-05-28 DIAGNOSIS — Z853 Personal history of malignant neoplasm of breast: Secondary | ICD-10-CM | POA: Diagnosis not present

## 2016-05-29 DIAGNOSIS — K219 Gastro-esophageal reflux disease without esophagitis: Secondary | ICD-10-CM | POA: Diagnosis not present

## 2016-05-29 DIAGNOSIS — R10816 Epigastric abdominal tenderness: Secondary | ICD-10-CM | POA: Diagnosis not present

## 2016-06-09 DIAGNOSIS — N390 Urinary tract infection, site not specified: Secondary | ICD-10-CM | POA: Diagnosis not present

## 2016-07-13 DIAGNOSIS — K219 Gastro-esophageal reflux disease without esophagitis: Secondary | ICD-10-CM | POA: Diagnosis not present

## 2016-07-13 DIAGNOSIS — R1013 Epigastric pain: Secondary | ICD-10-CM | POA: Diagnosis not present

## 2016-07-14 DIAGNOSIS — Z9049 Acquired absence of other specified parts of digestive tract: Secondary | ICD-10-CM | POA: Diagnosis not present

## 2016-07-14 DIAGNOSIS — R079 Chest pain, unspecified: Secondary | ICD-10-CM | POA: Diagnosis not present

## 2016-07-14 DIAGNOSIS — Z853 Personal history of malignant neoplasm of breast: Secondary | ICD-10-CM | POA: Diagnosis not present

## 2016-07-14 DIAGNOSIS — Z79899 Other long term (current) drug therapy: Secondary | ICD-10-CM | POA: Diagnosis not present

## 2016-07-14 DIAGNOSIS — R1013 Epigastric pain: Secondary | ICD-10-CM | POA: Diagnosis not present

## 2016-07-14 DIAGNOSIS — K219 Gastro-esophageal reflux disease without esophagitis: Secondary | ICD-10-CM | POA: Diagnosis not present

## 2016-07-14 DIAGNOSIS — Z86010 Personal history of colonic polyps: Secondary | ICD-10-CM | POA: Diagnosis not present

## 2016-07-14 DIAGNOSIS — K449 Diaphragmatic hernia without obstruction or gangrene: Secondary | ICD-10-CM | POA: Diagnosis not present

## 2016-07-14 DIAGNOSIS — I10 Essential (primary) hypertension: Secondary | ICD-10-CM | POA: Diagnosis not present

## 2016-07-14 DIAGNOSIS — Z8639 Personal history of other endocrine, nutritional and metabolic disease: Secondary | ICD-10-CM | POA: Diagnosis not present

## 2016-07-14 DIAGNOSIS — K208 Other esophagitis: Secondary | ICD-10-CM | POA: Diagnosis not present

## 2016-08-10 DIAGNOSIS — R1013 Epigastric pain: Secondary | ICD-10-CM | POA: Diagnosis not present

## 2016-08-10 DIAGNOSIS — K219 Gastro-esophageal reflux disease without esophagitis: Secondary | ICD-10-CM | POA: Diagnosis not present

## 2016-08-10 DIAGNOSIS — R079 Chest pain, unspecified: Secondary | ICD-10-CM | POA: Diagnosis not present

## 2016-08-25 DIAGNOSIS — Z79811 Long term (current) use of aromatase inhibitors: Secondary | ICD-10-CM | POA: Diagnosis not present

## 2016-08-25 DIAGNOSIS — Z170 Estrogen receptor positive status [ER+]: Secondary | ICD-10-CM | POA: Diagnosis not present

## 2016-08-25 DIAGNOSIS — Z853 Personal history of malignant neoplasm of breast: Secondary | ICD-10-CM | POA: Diagnosis not present

## 2016-08-25 DIAGNOSIS — C50912 Malignant neoplasm of unspecified site of left female breast: Secondary | ICD-10-CM | POA: Diagnosis not present

## 2016-09-19 DIAGNOSIS — R002 Palpitations: Secondary | ICD-10-CM

## 2016-09-19 DIAGNOSIS — I10 Essential (primary) hypertension: Secondary | ICD-10-CM

## 2016-09-19 HISTORY — DX: Essential (primary) hypertension: I10

## 2016-09-19 HISTORY — DX: Palpitations: R00.2

## 2016-09-22 DIAGNOSIS — I10 Essential (primary) hypertension: Secondary | ICD-10-CM | POA: Diagnosis not present

## 2016-09-22 DIAGNOSIS — Z6821 Body mass index (BMI) 21.0-21.9, adult: Secondary | ICD-10-CM | POA: Diagnosis not present

## 2016-09-22 DIAGNOSIS — E785 Hyperlipidemia, unspecified: Secondary | ICD-10-CM | POA: Diagnosis not present

## 2016-10-06 DIAGNOSIS — I10 Essential (primary) hypertension: Secondary | ICD-10-CM | POA: Diagnosis not present

## 2016-10-14 DIAGNOSIS — N390 Urinary tract infection, site not specified: Secondary | ICD-10-CM | POA: Diagnosis not present

## 2016-10-14 DIAGNOSIS — Z6820 Body mass index (BMI) 20.0-20.9, adult: Secondary | ICD-10-CM | POA: Diagnosis not present

## 2016-11-19 DIAGNOSIS — Z79811 Long term (current) use of aromatase inhibitors: Secondary | ICD-10-CM | POA: Diagnosis not present

## 2016-11-19 DIAGNOSIS — Z853 Personal history of malignant neoplasm of breast: Secondary | ICD-10-CM | POA: Diagnosis not present

## 2017-01-07 DIAGNOSIS — R922 Inconclusive mammogram: Secondary | ICD-10-CM | POA: Diagnosis not present

## 2017-01-07 DIAGNOSIS — C50412 Malignant neoplasm of upper-outer quadrant of left female breast: Secondary | ICD-10-CM | POA: Diagnosis not present

## 2017-01-07 DIAGNOSIS — Z9889 Other specified postprocedural states: Secondary | ICD-10-CM | POA: Diagnosis not present

## 2017-01-19 DIAGNOSIS — Z170 Estrogen receptor positive status [ER+]: Secondary | ICD-10-CM | POA: Diagnosis not present

## 2017-01-19 DIAGNOSIS — C50412 Malignant neoplasm of upper-outer quadrant of left female breast: Secondary | ICD-10-CM | POA: Diagnosis not present

## 2017-01-20 DIAGNOSIS — E559 Vitamin D deficiency, unspecified: Secondary | ICD-10-CM | POA: Diagnosis not present

## 2017-01-20 DIAGNOSIS — Z780 Asymptomatic menopausal state: Secondary | ICD-10-CM | POA: Diagnosis not present

## 2017-01-20 DIAGNOSIS — H6120 Impacted cerumen, unspecified ear: Secondary | ICD-10-CM | POA: Diagnosis not present

## 2017-01-20 DIAGNOSIS — Z23 Encounter for immunization: Secondary | ICD-10-CM | POA: Diagnosis not present

## 2017-01-20 DIAGNOSIS — M8580 Other specified disorders of bone density and structure, unspecified site: Secondary | ICD-10-CM | POA: Diagnosis not present

## 2017-01-20 DIAGNOSIS — Z0000 Encounter for general adult medical examination without abnormal findings: Secondary | ICD-10-CM | POA: Diagnosis not present

## 2017-01-20 DIAGNOSIS — I10 Essential (primary) hypertension: Secondary | ICD-10-CM | POA: Diagnosis not present

## 2017-01-20 DIAGNOSIS — E1165 Type 2 diabetes mellitus with hyperglycemia: Secondary | ICD-10-CM | POA: Diagnosis not present

## 2017-01-27 DIAGNOSIS — R011 Cardiac murmur, unspecified: Secondary | ICD-10-CM | POA: Insufficient documentation

## 2017-01-27 HISTORY — DX: Cardiac murmur, unspecified: R01.1

## 2017-01-28 NOTE — Progress Notes (Signed)
Cardiology Office Note:    Date:  02/01/2017   ID:  NALIYA GISH, DOB 01/23/1946, MRN [id number]  PCP:  Ronita Hipps, MD  Cardiologist:  Shirlee More, MD    Referring MD: Myrlene Broker, MD    ASSESSMENT:    1. Essential hypertension   2. Hypokalemia   3. Pure hypercholesterolemia    PLAN:    In order of problems listed above:  1. Stable home blood pressures are in target range she'll continue to monitor and additional therapy as needed a low-dose thiazide diuretic and potassium supplement she'll continue her ARB and beta blocker along with sodium restriction and assures me she'll get back to regular exercise 150 minutes per week 2. Resolved off thiazide diuretic 3. Poorly controlled at this time she'll not accxept  lipid-lowering treatment   Next appointment: 6 months   Medication Adjustments/Labs and Tests Ordered: Current medicines are reviewed at length with the patient today.  Concerns regarding medicines are outlined above.  Orders Placed This Encounter  Procedures  . EKG 12-Lead   No orders of the defined types were placed in this encounter.   Chief Complaint  Patient presents with  . Follow-up    4 month flup appt   . Hypertension  . Hyperlipidemia    History of Present Illness:    Elona Yinger Nowakowski is a 71 y.o. female with a hx of breast cancer with surgery, XRT and estrogrn blocker therapy and hypertension last seen in April 2018.Home BP is in range ,130/80. She is off her diuretic.Spironolactone had cause symptomatic hypotension with systolics less than 458. In the past she had hypokalemia with thiazide diuretic. We made a decision to monitor her blood pressure frequently daily or every other day recorder numbers and if her systolics are out of range to put her back on low-dose thiazide diuretic and potassium supplement. Her LDL is severely elevated advice therapy she will not take a statin she states that she is intolerant of it and has had friends  with an injured with the development of diabetes and neuropathy. I offered Zetia she said she would consider and prefers to adjust her diet. She's had no chest pain shortness of breath palpitation or syncope Compliance with diet, lifestyle and medications: yes Past Medical History:  Diagnosis Date  . Breast cancer of upper-outer quadrant of left female breast ([place]) 08/30/2015  . Essential hypertension 09/19/2016  . 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 Murmur 01/27/2017  . Palpitation 09/19/2016   Overview:  Overview:  Rapid HR, not documented    Past Surgical History:  Procedure Laterality Date  . ABDOMINAL HYSTERECTOMY    . APPENDECTOMY    . CHOLECYSTECTOMY    . THYROID SURGERY    . TONSILLECTOMY      Current Medications: Current Meds  Medication Sig  . anastrozole (ARIMIDEX) 1 MG tablet Take 1 mg by mouth daily.  [name] Kitchen esomeprazole (NEXIUM) 40 MG capsule Take 40 mg by mouth daily.  [name] Kitchen telmisartan (MICARDIS) 80 MG tablet Take 80 mg by mouth daily.  . Vitamin D, Ergocalciferol, (DRISDOL) 50000 units CAPS capsule Take 50,000 Units by mouth 2 (two) times a week.     Allergies:   Ciprofloxacin; Nitrofuran derivatives; Sulfa antibiotics; and Oxycodone   Social History   Social History  . Marital status: Married    Spouse name: N/A  . Number of children: N/A  . Years of education: N/A   Social History Main Topics  . Smoking status:  Never Smoker  . Smokeless tobacco: Never Used  . Alcohol use No  . Drug use: No  . Sexual activity: Not Asked   Other Topics Concern  . None   Social History Narrative  . None     Family History: The patient's family history includes Heart attack in her maternal grandmother and mother; Heart failure in her mother; Hypertension in her father. ROS:   Please see the history of present illness.    All other systems reviewed and are negative.  EKGs/Labs/Other Studies Reviewed:    The following studies were reviewed today:  EKG:  EKG  ordered today.  The ekg ordered today demonstrates [place] qs in v2 OW normal 01/20/17 CBC, CMP K 4.0 Chol 249, LDL 167 Recent Labs: 10/06/16 CMP is normal, na 133 K 4.5 No results found for requested labs within last 8760 hours.  Recent Lipid Panel No results found for: CHOL, TRIG, HDL, CHOLHDL, VLDL, LDLCALC, LDLDIRECT  Physical Exam:    VS:  BP (!) 146/92 (BP Location: Right Arm, Patient Position: Sitting)   Pulse 77   Ht 5\' 7"  (1.702 m)   Wt 140 lb 6.4 oz (63.7 kg)   SpO2 97%   BMI 21.99 kg/m     Wt Readings from Last 3 Encounters:  02/01/17 140 lb 6.4 oz (63.7 kg)    Repeat blood pressure 150/86 GEN:  Well nourished, well developed in no acute distress HEENT: Normal NECK: No JVD; No carotid bruits LYMPHATICS: No lymphadenopathy CARDIAC: RRR, no murmurs, rubs, gallops RESPIRATORY:  Clear to auscultation without rales, wheezing or rhonchi  ABDOMEN: Soft, non-tender, non-distended MUSCULOSKELETAL:  No edema; No deformity  SKIN: Warm and dry NEUROLOGIC:  Alert and oriented x 3 PSYCHIATRIC:  Normal affect    Signed, Shirlee More, MD  02/01/2017 9:40 AM    [place]

## 2017-02-01 ENCOUNTER — Ambulatory Visit (INDEPENDENT_AMBULATORY_CARE_PROVIDER_SITE_OTHER): Payer: Medicare Other | Admitting: Cardiology

## 2017-02-01 ENCOUNTER — Encounter: Payer: Self-pay | Admitting: Cardiology

## 2017-02-01 VITALS — BP 146/92 | HR 77 | Ht 67.0 in | Wt 140.4 lb

## 2017-02-01 DIAGNOSIS — Z789 Other specified health status: Secondary | ICD-10-CM | POA: Diagnosis not present

## 2017-02-01 DIAGNOSIS — E785 Hyperlipidemia, unspecified: Secondary | ICD-10-CM

## 2017-02-01 DIAGNOSIS — E876 Hypokalemia: Secondary | ICD-10-CM | POA: Diagnosis not present

## 2017-02-01 DIAGNOSIS — I10 Essential (primary) hypertension: Secondary | ICD-10-CM | POA: Diagnosis not present

## 2017-02-01 DIAGNOSIS — E7800 Pure hypercholesterolemia, unspecified: Secondary | ICD-10-CM | POA: Diagnosis not present

## 2017-02-01 HISTORY — DX: Hyperlipidemia, unspecified: E78.5

## 2017-02-01 HISTORY — DX: Other specified health status: Z78.9

## 2017-02-01 HISTORY — DX: Hypokalemia: E87.6

## 2017-02-01 NOTE — Patient Instructions (Addendum)
Medication Instructions:  Your physician recommends that you continue on your current medications as directed. Please refer to the Current Medication list given to you today.   Labwork: None  Testing/Procedures: You had an EKG Today  Follow-Up: Your physician wants you to follow-up in: 6 months. You will receive a reminder letter in the mail two months in advance. If you don't receive a letter, please call our office to schedule the follow-up appointment.   Any Other Special Instructions Will Be Listed Below (If Applicable).     If you need a refill on your cardiac medications before your next appointment, please call your pharmacy.       DASH diet: Healthy eating to lower your blood pressure The DASH diet emphasizes portion size, eating a variety of foods and getting the right amount of nutrients. Discover how DASH can improve your health and lower your blood pressure. By [facility] Staff  DASH stands for Dietary Approaches to Stop Hypertension. The DASH diet is a lifelong approach to healthy eating that's designed to help treat or prevent high blood pressure (hypertension). The DASH diet encourages you to reduce the sodium in your diet and eat a variety of foods rich in nutrients that help lower blood pressure, such as potassium, calcium and magnesium. By following the DASH diet, you may be able to reduce your blood pressure by a few points in just two weeks. Over time, your systolic blood pressure could drop by eight to 14 points, which can make a significant difference in your health risks. Because the DASH diet is a healthy way of eating, it offers health benefits besides just lowering blood pressure. The DASH diet is also in line with dietary recommendations to prevent osteoporosis, cancer, heart disease, stroke and diabetes. DASH diet: Sodium levels The DASH diet emphasizes vegetables, fruits and low-fat dairy foods - and moderate amounts of whole grains, fish, poultry and  nuts. In addition to the standard DASH diet, there is also a lower sodium version of the diet. You can choose the version of the diet that meets your health needs: Standard DASH diet. You can consume up to 2,300 milligrams (mg) of sodium a day.  Lower sodium DASH diet. You can consume up to 1,500 mg of sodium a day. Both versions of the DASH diet aim to reduce the amount of sodium in your diet compared with what you might get in a typical American diet, which can amount to a whopping 3,400 mg of sodium a day or more. The standard DASH diet meets the recommendation from the Dietary Guidelines for Americans to keep daily sodium intake to less than 2,300 mg a day. The American Heart Association recommends 1,500 mg a day of sodium as an upper limit for all adults. If you aren't sure what sodium level is right for you, talk to your doctor. DASH diet: What to eat Both versions of the DASH diet include lots of whole grains, fruits, vegetables and low-fat dairy products. The DASH diet also includes some fish, poultry and legumes, and encourages a small amount of nuts and seeds a few times a week.  You can eat red meat, sweets and fats in small amounts. The DASH diet is low in saturated fat, cholesterol and total fat. Here's a look at the recommended servings from each food group for the 2,000-calorie-a-day DASH diet. Grains: 6 to 8 servings a day Grains include bread, cereal, rice and pasta. Examples of one serving of grains include 1 slice whole-wheat bread,  1 ounce dry cereal, or 1/2 cup cooked cereal, rice or pasta. Focus on whole grains because they have more fiber and nutrients than do refined grains. For instance, use brown rice instead of white rice, whole-wheat pasta instead of regular pasta and whole-grain bread instead of white bread. Look for products labeled "100 percent whole grain" or "100 percent whole wheat."  Grains are naturally low in fat. Keep them this way by avoiding butter, cream and  cheese sauces. Vegetables: 4 to 5 servings a day Tomatoes, carrots, broccoli, sweet potatoes, greens and other vegetables are full of fiber, vitamins, and such minerals as potassium and magnesium. Examples of one serving include 1 cup raw leafy green vegetables or 1/2 cup cut-up raw or cooked vegetables. Don't think of vegetables only as side dishes - a hearty blend of vegetables served over brown rice or whole-wheat noodles can serve as the main dish for a meal.  Fresh and frozen vegetables are both good choices. When buying frozen and canned vegetables, choose those labeled as low sodium or without added salt.  To increase the number of servings you fit in daily, be creative. In a stir-fry, for instance, cut the amount of meat in half and double up on the vegetables. Fruits: 4 to 5 servings a day Many fruits need little preparation to become a healthy part of a meal or snack. Like vegetables, they're packed with fiber, potassium and magnesium and are typically low in fat - coconuts are an exception. Examples of one serving include one medium fruit, 1/2 cup fresh, frozen or canned fruit, or 4 ounces of juice. Have a piece of fruit with meals and one as a snack, then round out your day with a dessert of fresh fruits topped with a dollop of low-fat yogurt.  Leave on edible peels whenever possible. The peels of apples, pears and most fruits with pits add interesting texture to recipes and contain healthy nutrients and fiber.  Remember that citrus fruits and juices, such as grapefruit, can interact with certain medications, so check with your doctor or pharmacist to see if they're OK for you.  If you choose canned fruit or juice, make sure no sugar is added. Dairy: 2 to 3 servings a day Milk, yogurt, cheese and other dairy products are major sources of calcium, vitamin D and protein. But the key is to make sure that you choose dairy products that are low fat or fat-free because otherwise they can be a  major source of fat - and most of it is saturated. Examples of one serving include 1 cup skim or 1 percent milk, 1 cup low fat yogurt, or 1 1/2 ounces part-skim cheese. Low-fat or fat-free frozen yogurt can help you boost the amount of dairy products you eat while offering a sweet treat. Add fruit for a healthy twist.  If you have trouble digesting dairy products, choose lactose-free products or consider taking an over-the-counter product that contains the enzyme lactase, which can reduce or prevent the symptoms of lactose intolerance.  Go easy on regular and even fat-free cheeses because they are typically high in sodium. Lean meat, poultry and fish: 6 servings or fewer a day Meat can be a rich source of protein, B vitamins, iron and zinc. Choose lean varieties and aim for no more than 6 ounces a day. Cutting back on your meat portion will allow room for more vegetables. Trim away skin and fat from poultry and meat and then bake, broil, grill or roast instead  of frying in fat.  Eat heart-healthy fish, such as salmon, herring and tuna. These types of fish are high in omega-3 fatty acids, which can help lower your total cholesterol. Nuts, seeds and legumes: 4 to 5 servings a week Almonds, sunflower seeds, kidney beans, peas, lentils and other foods in this family are good sources of magnesium, potassium and protein. They're also full of fiber and phytochemicals, which are plant compounds that may protect against some cancers and cardiovascular disease. Serving sizes are small and are intended to be consumed only a few times a week because these foods are high in calories. Examples of one serving include 1/3 cup nuts, 2 tablespoons seeds, or 1/2 cup cooked beans or peas.  Nuts sometimes get a bad rap because of their fat content, but they contain healthy types of fat - monounsaturated fat and omega-3 fatty acids. They're high in calories, however, so eat them in moderation. Try adding them to stir-fries,  salads or cereals.  Soybean-based products, such as tofu and tempeh, can be a good alternative to meat because they contain all of the amino acids your body needs to make a complete protein, just like meat. Fats and oils: 2 to 3 servings a day Fat helps your body absorb essential vitamins and helps your body's immune system. But too much fat increases your risk of heart disease, diabetes and obesity. The DASH diet strives for a healthy balance by limiting total fat to less than 30 percent of daily calories from fat, with a focus on the healthier monounsaturated fats. Examples of one serving include 1 teaspoon soft margarine, 1 tablespoon mayonnaise or 2 tablespoons salad dressing. Saturated fat and trans fat are the main dietary culprits in increasing your risk of coronary artery disease. DASH helps keep your daily saturated fat to less than 6 percent of your total calories by limiting use of meat, butter, cheese, whole milk, cream and eggs in your diet, along with foods made from lard, solid shortenings, and palm and coconut oils.  Avoid trans fat, commonly found in such processed foods as crackers, baked goods and fried items.  Read food labels on margarine and salad dressing so that you can choose those that are lowest in saturated fat and free of trans fat. Sweets: 5 servings or fewer a week You don't have to banish sweets entirely while following the DASH diet - just go easy on them. Examples of one serving include 1 tablespoon sugar, jelly or jam, 1/2 cup sorbet, or 1 cup lemonade. When you eat sweets, choose those that are fat-free or low-fat, such as sorbets, fruit ices, jelly beans, hard candy, graham crackers or low-fat cookies.  Artificial sweeteners such as aspartame (NutraSweet, Equal) and sucralose (Splenda) may help satisfy your sweet tooth while sparing the sugar. But remember that you still must use them sensibly. It's OK to swap a diet cola for a regular cola, but not in place of a more  nutritious beverage such as low-fat milk or even plain water.  Cut back on added sugar, which has no nutritional value but can pack on calories. DASH diet: Alcohol and caffeine Drinking too much alcohol can increase blood pressure. The Dietary Guidelines for Americans recommends that men limit alcohol to no more than two drinks a day and women to one or less. The DASH diet doesn't address caffeine consumption. The influence of caffeine on blood pressure remains unclear. But caffeine can cause your blood pressure to rise at least temporarily. If you already  have high blood pressure or if you think caffeine is affecting your blood pressure, talk to your doctor about your caffeine consumption. DASH diet and weight loss While the DASH diet is not a weight-loss program, you may indeed lose unwanted pounds because it can help guide you toward healthier food choices. The DASH diet generally includes about 2,000 calories a day. If you're trying to lose weight, you may need to eat fewer calories. You may also need to adjust your serving goals based on your individual circumstances - something your health care team can help you decide. Tips to cut back on sodium The foods at the core of the DASH diet are naturally low in sodium. So just by following the DASH diet, you're likely to reduce your sodium intake. You also reduce sodium further by: Using sodium-free spices or flavorings with your food instead of salt  Not adding salt when cooking rice, pasta or hot cereal  Rinsing canned foods to remove some of the sodium  Buying foods labeled "no salt added," "sodium-free," "low sodium" or "very low sodium" One teaspoon of table salt has 2,325 mg of sodium. When you read food labels, you may be surprised at just how much sodium some processed foods contain. Even low-fat soups, canned vegetables, ready-to-eat cereals and sliced [place] from the local deli - foods you may have considered healthy - often have lots of  sodium. You may notice a difference in taste when you choose low-sodium food and beverages. If things seem too bland, gradually introduce low-sodium foods and cut back on table salt until you reach your sodium goal. That'll give your palate time to adjust. Using salt-free seasoning blends or herbs and spices may also ease the transition. It can take several weeks for your taste buds to get used to less salty foods. Putting the pieces of the DASH diet together Try these strategies to get started on the DASH diet:  Change gradually. If you now eat only one or two servings of fruits or vegetables a day, try to add a serving at lunch and one at dinner. Rather than switching to all whole grains, start by making one or two of your grain servings whole grains. Increasing fruits, vegetables and whole grains gradually can also help prevent bloating or diarrhea that may occur if you aren't used to eating a diet with lots of fiber. You can also try over-the-counter products to help reduce gas from beans and vegetables.  Reward successes and forgive slip-ups. Reward yourself with a nonfood treat for your accomplishments - rent a movie, purchase a book or get together with a friend. Everyone slips, especially when learning something new. Remember that changing your lifestyle is a long-term process. Find out what triggered your setback and then just pick up where you left off with the DASH diet.  Add physical activity. To boost your blood pressure lowering efforts even more, consider increasing your physical activity in addition to following the DASH diet. Combining both the DASH diet and physical activity makes it more likely that you'll reduce your blood pressure.  Get support if you need it. If you're having trouble sticking to your diet, talk to your doctor or dietitian about it. You might get some tips that will help you stick to the DASH diet. Remember, healthy eating isn't an all-or-nothing proposition. What's  most important is that, on average, you eat healthier foods with plenty of variety - both to keep your diet nutritious and to avoid boredom or extremes.  And with the DASH diet, you can have both.

## 2017-02-06 HISTORY — PX: MOHS SURGERY: SUR867

## 2017-02-09 DIAGNOSIS — D2271 Melanocytic nevi of right lower limb, including hip: Secondary | ICD-10-CM | POA: Diagnosis not present

## 2017-02-09 DIAGNOSIS — L821 Other seborrheic keratosis: Secondary | ICD-10-CM | POA: Diagnosis not present

## 2017-02-09 DIAGNOSIS — D2239 Melanocytic nevi of other parts of face: Secondary | ICD-10-CM | POA: Diagnosis not present

## 2017-02-09 DIAGNOSIS — D2261 Melanocytic nevi of right upper limb, including shoulder: Secondary | ICD-10-CM | POA: Diagnosis not present

## 2017-02-09 DIAGNOSIS — D485 Neoplasm of uncertain behavior of skin: Secondary | ICD-10-CM | POA: Diagnosis not present

## 2017-02-09 DIAGNOSIS — L820 Inflamed seborrheic keratosis: Secondary | ICD-10-CM | POA: Diagnosis not present

## 2017-02-09 DIAGNOSIS — Z85828 Personal history of other malignant neoplasm of skin: Secondary | ICD-10-CM | POA: Diagnosis not present

## 2017-02-09 DIAGNOSIS — C44311 Basal cell carcinoma of skin of nose: Secondary | ICD-10-CM | POA: Diagnosis not present

## 2017-02-09 DIAGNOSIS — D1801 Hemangioma of skin and subcutaneous tissue: Secondary | ICD-10-CM | POA: Diagnosis not present

## 2017-03-04 DIAGNOSIS — C44311 Basal cell carcinoma of skin of nose: Secondary | ICD-10-CM | POA: Diagnosis not present

## 2017-03-04 DIAGNOSIS — Z85828 Personal history of other malignant neoplasm of skin: Secondary | ICD-10-CM | POA: Diagnosis not present

## 2017-03-12 DIAGNOSIS — Z23 Encounter for immunization: Secondary | ICD-10-CM | POA: Diagnosis not present

## 2017-03-17 DIAGNOSIS — Z85828 Personal history of other malignant neoplasm of skin: Secondary | ICD-10-CM | POA: Diagnosis not present

## 2017-03-17 DIAGNOSIS — Z79811 Long term (current) use of aromatase inhibitors: Secondary | ICD-10-CM | POA: Diagnosis not present

## 2017-03-17 DIAGNOSIS — C50912 Malignant neoplasm of unspecified site of left female breast: Secondary | ICD-10-CM | POA: Diagnosis not present

## 2017-03-17 DIAGNOSIS — Z853 Personal history of malignant neoplasm of breast: Secondary | ICD-10-CM | POA: Diagnosis not present

## 2017-03-17 DIAGNOSIS — Z170 Estrogen receptor positive status [ER+]: Secondary | ICD-10-CM | POA: Diagnosis not present

## 2017-07-16 DIAGNOSIS — Z79811 Long term (current) use of aromatase inhibitors: Secondary | ICD-10-CM | POA: Diagnosis not present

## 2017-07-16 DIAGNOSIS — Z853 Personal history of malignant neoplasm of breast: Secondary | ICD-10-CM | POA: Diagnosis not present

## 2017-07-19 DIAGNOSIS — H4301 Vitreous prolapse, right eye: Secondary | ICD-10-CM | POA: Diagnosis not present

## 2017-08-05 DIAGNOSIS — Z6822 Body mass index (BMI) 22.0-22.9, adult: Secondary | ICD-10-CM | POA: Diagnosis not present

## 2017-08-05 DIAGNOSIS — N3000 Acute cystitis without hematuria: Secondary | ICD-10-CM | POA: Diagnosis not present

## 2017-08-16 DIAGNOSIS — H4301 Vitreous prolapse, right eye: Secondary | ICD-10-CM | POA: Diagnosis not present

## 2017-08-31 NOTE — Progress Notes (Deleted)
Cardiology Office Note:    Date:  09/01/2017   ID:  Natalie Mack, DOB 1945/08/06, MRN [id number]  PCP:  Ronita Hipps, MD  Cardiologist:  Shirlee More, MD    Referring MD: Ronita Hipps, MD    ASSESSMENT:    1. Essential hypertension   2. Hypokalemia   3. Pure hypercholesterolemia    PLAN:    In order of problems listed above:  1. Stable home blood pressures are consistently less than 130-140 and continue current treatment carvedilol and ARB and avoid thiazide diuretics with her previous hypokalemia.  If her blood pressure was difficult to control distal diuretic spironolactone would be appropriate. 2. Recheck BMP today also check lipids. 3. Check lipid profile today she is at increased cardiovascular risk with her breast cancer.    Next appointment: 6 months   Medication Adjustments/Labs and Tests Ordered: Current medicines are reviewed at length with the patient today.  Concerns regarding medicines are outlined above.  No orders of the defined types were placed in this encounter.  No orders of the defined types were placed in this encounter.   Chief Complaint  Patient presents with  . Follow-up    6 month flup appt  . Hypertension  . Hyperlipidemia    History of Present Illness:    Natalie Mack   Cardiology Office Note:    Date:  09/01/2017   ID:  Natalie Mack, DOB 03-May-1946, MRN [id number]  PCP:  Ronita Hipps, MD  Cardiologist:  Shirlee More, MD    Referring MD: Ronita Hipps, MD    ASSESSMENT:    1. Essential hypertension   2. Hypokalemia   3. Pure hypercholesterolemia    PLAN:    In order of problems listed above:  4. ***   Next appointment: ***   Medication Adjustments/Labs and Tests Ordered: Current medicines are reviewed at length with the patient today.  Concerns regarding medicines are outlined above.  Orders Placed This Encounter  Procedures  . Basic metabolic panel  . Lipid Profile   No orders of the defined  types were placed in this encounter.   Chief Complaint  Patient presents with  . Follow-up    6 month flup appt  . Hypertension  . Hyperlipidemia    History of Present Illness:    Natalie Mack is a 72 y.o. female with a hx of *** last seen ***. Compliance with diet, lifestyle and medications: *** Past Medical History:  Diagnosis Date  . Breast cancer of upper-outer quadrant of left female breast ([place]) 08/30/2015  . Essential hypertension 09/19/2016  . 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 Murmur 01/27/2017  . Palpitation 09/19/2016   Overview:  Overview:  Rapid HR, not documented    Past Surgical History:  Procedure Laterality Date  . ABDOMINAL HYSTERECTOMY    . APPENDECTOMY    . CESAREAN SECTION    . CHOLECYSTECTOMY    . MOHS SURGERY  02/2017  . THYROID SURGERY    . TONSILLECTOMY      Current Medications: Current Meds  Medication Sig  . anastrozole (ARIMIDEX) 1 MG tablet Take 1 mg by mouth daily.  . Calcium Carbonate-Vitamin D (CALCIUM 600+D PO) Take 1 tablet by mouth daily.  . carvedilol (COREG) 12.5 MG tablet Take 0.5 tablets by mouth 2 (two) times daily.  [name] Kitchen esomeprazole (NEXIUM) 40 MG capsule Take 40 mg by mouth daily as needed (heartburn).   [name] Kitchen telmisartan (MICARDIS) 80 MG tablet Take  80 mg by mouth daily.  . Vitamin D, Ergocalciferol, (DRISDOL) 50000 units CAPS capsule Take 50,000 Units by mouth 2 (two) times a week.     Allergies:   Ciprofloxacin; Nitrofuran derivatives; Sulfa antibiotics; and Oxycodone   Social History   Socioeconomic History  . Marital status: Married    Spouse name: Not on file  . Number of children: Not on file  . Years of education: Not on file  . Highest education level: Not on file  Occupational History  . Not on file  Social Needs  . Financial resource strain: Not on file  . Food insecurity:    Worry: Not on file    Inability: Not on file  . Transportation needs:    Medical: Not on file    Non-medical: Not on  file  Tobacco Use  . Smoking status: Never Smoker  . Smokeless tobacco: Never Used  Substance and Sexual Activity  . Alcohol use: No  . Drug use: No  . Sexual activity: Not on file  Lifestyle  . Physical activity:    Days per week: Not on file    Minutes per session: Not on file  . Stress: Not on file  Relationships  . Social connections:    Talks on phone: Not on file    Gets together: Not on file    Attends religious service: Not on file    Active member of club or organization: Not on file    Attends meetings of clubs or organizations: Not on file    Relationship status: Not on file  Other Topics Concern  . Not on file  Social History Narrative  . Not on file     Family History: The patient's ***family history includes Heart attack in her maternal grandmother and mother; Heart failure in her mother; Hypertension in her father. ROS:   Please see the history of present illness.    All other systems reviewed and are negative.  EKGs/Labs/Other Studies Reviewed:    The following studies were reviewed today:  EKG:  EKG ordered today.  The ekg ordered today demonstrates ***  Recent Labs: No results found for requested labs within last 8760 hours.  Recent Lipid Panel No results found for: CHOL, TRIG, HDL, CHOLHDL, VLDL, LDLCALC, LDLDIRECT  Physical Exam:    VS:  BP 140/80 (BP Location: Right Arm, Patient Position: Standing, Cuff Size: Normal)   Pulse 76   Ht 5\' 7"  (1.702 m)   Wt 142 lb 12.8 oz (64.8 kg)   SpO2 99%   BMI 22.37 kg/m     Wt Readings from Last 3 Encounters:  09/01/17 142 lb 12.8 oz (64.8 kg)  02/01/17 140 lb 6.4 oz (63.7 kg)     GEN: *** Well nourished, well developed in no acute distress HEENT: Normal NECK: No JVD; No carotid bruits LYMPHATICS: No lymphadenopathy CARDIAC: ***RRR, no murmurs, rubs, gallops RESPIRATORY:  Clear to auscultation without rales, wheezing or rhonchi  ABDOMEN: Soft, non-tender, non-distended MUSCULOSKELETAL:  No  edema; No deformity  SKIN: Warm and dry NEUROLOGIC:  Alert and oriented x 3 PSYCHIATRIC:  Normal affect    Signed, Shirlee More, MD  09/01/2017 9:09 AM    Natalie Mack  is a 72 y.o. female with a hx of breast cancer with surgery, XRT and estrogrn blocker therapy and hypertension l  last seen 02/01/17.  ASSESSMENT:    02/01/17   1. Essential hypertension   2. Hypokalemia   3. Pure hypercholesterolemia  PLAN:    1. Stable home blood pressures are in target range she'll continue to monitor and additional therapy as needed a low-dose thiazide diuretic and potassium supplement she'll continue her ARB and beta blocker along with sodium restriction and assures me she'll get back to regular exercise 150 minutes per week 2. Resolved off thiazide diuretic 3. Poorly controlled at this time she'll not accxept  lipid-lowering treatment  Compliance with diet, lifestyle and medications: *** Past Medical History:  Diagnosis Date  . Breast cancer of upper-outer quadrant of left female breast ([place]) 08/30/2015  . Essential hypertension 09/19/2016  . 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 Murmur 01/27/2017  . Palpitation 09/19/2016   Overview:  Overview:  Rapid HR, not documented    Past Surgical History:  Procedure Laterality Date  . ABDOMINAL HYSTERECTOMY    . APPENDECTOMY    . CESAREAN SECTION    . CHOLECYSTECTOMY    . MOHS SURGERY  02/2017  . THYROID SURGERY    . TONSILLECTOMY      Current Medications: Current Meds  Medication Sig  . anastrozole (ARIMIDEX) 1 MG tablet Take 1 mg by mouth daily.  . Calcium Carbonate-Vitamin D (CALCIUM 600+D PO) Take 1 tablet by mouth daily.  . carvedilol (COREG) 12.5 MG tablet Take 0.5 tablets by mouth 2 (two) times daily.  [name] Kitchen esomeprazole (NEXIUM) 40 MG capsule Take 40 mg by mouth daily as needed (heartburn).   [name] Kitchen telmisartan (MICARDIS) 80 MG tablet Take 80 mg by mouth daily.  . Vitamin D, Ergocalciferol, (DRISDOL)  50000 units CAPS capsule Take 50,000 Units by mouth 2 (two) times a week.     Allergies:   Ciprofloxacin; Nitrofuran derivatives; Sulfa antibiotics; and Oxycodone   Social History   Socioeconomic History  . Marital status: Married    Spouse name: Not on file  . Number of children: Not on file  . Years of education: Not on file  . Highest education level: Not on file  Occupational History  . Not on file  Social Needs  . Financial resource strain: Not on file  . Food insecurity:    Worry: Not on file    Inability: Not on file  . Transportation needs:    Medical: Not on file    Non-medical: Not on file  Tobacco Use  . Smoking status: Never Smoker  . Smokeless tobacco: Never Used  Substance and Sexual Activity  . Alcohol use: No  . Drug use: No  . Sexual activity: Not on file  Lifestyle  . Physical activity:    Days per week: Not on file    Minutes per session: Not on file  . Stress: Not on file  Relationships  . Social connections:    Talks on phone: Not on file    Gets together: Not on file    Attends religious service: Not on file    Active member of club or organization: Not on file    Attends meetings of clubs or organizations: Not on file    Relationship status: Not on file  Other Topics Concern  . Not on file  Social History Narrative  . Not on file     Family History: The patient's ***family history includes Heart attack in her maternal grandmother and mother; Heart failure in her mother; Hypertension in her father. ROS:   Please see the history of present illness.    All other systems reviewed and are negative.  EKGs/Labs/Other Studies Reviewed:    The following  studies were reviewed today:  EKG:  EKG ordered today.  The ekg ordered today demonstrates ***  Recent Labs: No results found for requested labs within last 8760 hours.  Recent Lipid Panel No results found for: CHOL, TRIG, HDL, CHOLHDL, VLDL, LDLCALC, LDLDIRECT  Physical Exam:    VS:   BP (!) 172/96 (BP Location: Right Arm, Patient Position: Sitting, Cuff Size: Normal)   Pulse 76   Ht 5\' 7"  (1.702 m)   Wt 142 lb 12.8 oz (64.8 kg)   SpO2 99%   BMI 22.37 kg/m     Wt Readings from Last 3 Encounters:  09/01/17 142 lb 12.8 oz (64.8 kg)  02/01/17 140 lb 6.4 oz (63.7 kg)     GEN:  Well nourished, well developed in no acute distress HEENT: Normal NECK: No JVD; No carotid bruits LYMPHATICS: No lymphadenopathy CARDIAC: RRR, no murmurs, rubs, gallops RESPIRATORY:  Clear to auscultation without rales, wheezing or rhonchi  ABDOMEN: Soft, non-tender, non-distended MUSCULOSKELETAL:  No edema; No deformity  SKIN: Warm and dry NEUROLOGIC:  Alert and oriented x 3 PSYCHIATRIC:  Normal affect    Signed, Shirlee More, MD  09/01/2017 8:54 AM    Natalie Mack

## 2017-09-01 ENCOUNTER — Encounter: Payer: Self-pay | Admitting: Cardiology

## 2017-09-01 ENCOUNTER — Ambulatory Visit (INDEPENDENT_AMBULATORY_CARE_PROVIDER_SITE_OTHER): Payer: Medicare Other | Admitting: Cardiology

## 2017-09-01 VITALS — BP 140/80 | HR 76 | Ht 67.0 in | Wt 142.8 lb

## 2017-09-01 DIAGNOSIS — E876 Hypokalemia: Secondary | ICD-10-CM | POA: Diagnosis not present

## 2017-09-01 DIAGNOSIS — I10 Essential (primary) hypertension: Secondary | ICD-10-CM | POA: Diagnosis not present

## 2017-09-01 DIAGNOSIS — E7800 Pure hypercholesterolemia, unspecified: Secondary | ICD-10-CM | POA: Diagnosis not present

## 2017-09-01 NOTE — Patient Instructions (Signed)
Medication Instructions:  Your physician recommends that you continue on your current medications as directed. Please refer to the Current Medication list given to you today.  Labwork: Your physician recommends that you have the following labs drawn: BMP, lipid  Testing/Procedures: None  Follow-Up: Your physician wants you to follow-up in: 6 months. You will receive a reminder letter in the mail two months in advance. If you don't receive a letter, please call our office to schedule the follow-up appointment.  Check home blood pressure for 2-3 weeks and record.  Any Other Special Instructions Will Be Listed Below (If Applicable).     If you need a refill on your cardiac medications before your next appointment, please call your pharmacy.

## 2017-09-02 LAB — BASIC METABOLIC PANEL
BUN/Creatinine Ratio: 17 (ref 12–28)
BUN: 13 mg/dL (ref 8–27)
CALCIUM: 9.3 mg/dL (ref 8.7–10.3)
CO2: 24 mmol/L (ref 20–29)
Chloride: 100 mmol/L (ref 96–106)
Creatinine, Ser: 0.75 mg/dL (ref 0.57–1.00)
GFR calc Af Amer: 91 mL/min/{1.73_m2} (ref >59)
GFR calc non Af Amer: 79 mL/min/{1.73_m2} (ref >59)
GLUCOSE: 110 mg/dL — AB (ref 65–99)
Potassium: 4 mmol/L (ref 3.5–5.2)
Sodium: 139 mmol/L (ref 134–144)

## 2017-09-02 LAB — LIPID PANEL
CHOLESTEROL TOTAL: 257 mg/dL — AB (ref 100–199)
Chol/HDL Ratio: 4.4 ratio (ref 0.0–4.4)
HDL: 59 mg/dL (ref >39)
LDL Calculated: 169 mg/dL — ABNORMAL HIGH (ref 0–99)
Triglycerides: 147 mg/dL (ref 0–149)
VLDL CHOLESTEROL CAL: 29 mg/dL (ref 5–40)

## 2017-09-06 NOTE — Progress Notes (Signed)
Duplicate note

## 2017-10-23 ENCOUNTER — Emergency Department (HOSPITAL_COMMUNITY)
Admission: EM | Admit: 2017-10-23 | Discharge: 2017-10-23 | Disposition: A | Discharge status: Home / Self Care | Payer: Medicare Other | Attending: Emergency Medicine | Admitting: Emergency Medicine

## 2017-10-23 ENCOUNTER — Encounter (HOSPITAL_COMMUNITY): Payer: Self-pay | Admitting: *Deleted

## 2017-10-23 ENCOUNTER — Emergency Department (HOSPITAL_COMMUNITY): Payer: Medicare Other

## 2017-10-23 ENCOUNTER — Other Ambulatory Visit: Payer: Self-pay

## 2017-10-23 DIAGNOSIS — Z79899 Other long term (current) drug therapy: Secondary | ICD-10-CM | POA: Insufficient documentation

## 2017-10-23 DIAGNOSIS — I10 Essential (primary) hypertension: Secondary | ICD-10-CM | POA: Diagnosis not present

## 2017-10-23 DIAGNOSIS — R0789 Other chest pain: Secondary | ICD-10-CM | POA: Diagnosis not present

## 2017-10-23 DIAGNOSIS — R002 Palpitations: Secondary | ICD-10-CM | POA: Diagnosis not present

## 2017-10-23 LAB — CBC
HCT: 41 % (ref 36.0–46.0)
HEMOGLOBIN: 13.7 g/dL (ref 12.0–15.0)
MCH: 28.4 pg (ref 26.0–34.0)
MCHC: 33.4 g/dL (ref 30.0–36.0)
MCV: 84.9 fL (ref 78.0–100.0)
Platelets: 195 10*3/uL (ref 150–400)
RBC: 4.83 MIL/uL (ref 3.87–5.11)
RDW: 12.2 % (ref 11.5–15.5)
WBC: 4.9 10*3/uL (ref 4.0–10.5)

## 2017-10-23 LAB — BASIC METABOLIC PANEL
ANION GAP: 10 (ref 5–15)
BUN: 13 mg/dL (ref 6–20)
CALCIUM: 9.3 mg/dL (ref 8.9–10.3)
CO2: 23 mmol/L (ref 22–32)
CREATININE: 1.12 mg/dL — AB (ref 0.44–1.00)
Chloride: 106 mmol/L (ref 101–111)
GFR calc Af Amer: 55 mL/min — ABNORMAL LOW (ref >60)
GFR, EST NON AFRICAN AMERICAN: 48 mL/min — AB (ref >60)
Glucose, Bld: 138 mg/dL — ABNORMAL HIGH (ref 65–99)
Potassium: 3.5 mmol/L (ref 3.5–5.1)
Sodium: 139 mmol/L (ref 135–145)

## 2017-10-23 LAB — I-STAT TROPONIN, ED
TROPONIN I, POC: 0.01 ng/mL (ref 0.00–0.08)
TROPONIN I, POC: 0 ng/mL (ref 0.00–0.08)

## 2017-10-23 LAB — TSH: TSH: 1.905 u[IU]/mL (ref 0.350–4.500)

## 2017-10-23 NOTE — ED Notes (Signed)
Pt just arrived to room,  Changing into gown.   I will come back and draw labs.

## 2017-10-23 NOTE — ED Provider Notes (Signed)
[facility] DEPART[facility] Provider Note   CSN: [id number] Arrival date & time: 10/23/17  1756     History   Chief Complaint Chief Complaint  Patient presents with  . Palpitations    HPI Natalie Mack is a 72 y.o. female.  Patient with history of breast cancer 3 years ago currently breast cancer free, high blood pressure compliant with medications hyperlipidemia presents after 2 episodes of palpitations. Around 4:00 this afternoon patient had 2 episodeslasting a few minutes of heart racing. Patient has not had any since. No history of cardiac problems or arrhythmias. No history of atrial fibrillation.No fevers or chills. No weight loss. Patient had very brief chest pressure with it. No exertional symptoms.     Past Medical History:  Diagnosis Date  . Breast cancer of upper-outer quadrant of left female breast ([place]) 08/30/2015  . Essential hypertension 09/19/2016  . 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 Murmur 01/27/2017  . Palpitation 09/19/2016   Overview:  Overview:  Rapid HR, not documented    Patient Active Problem List   Diagnosis Date Noted  . Hypokalemia 02/01/2017  . Hyperlipidemia 02/01/2017  . Statin intolerance 02/01/2017  . Murmur 01/27/2017  . Essential hypertension 09/19/2016  . Palpitation 09/19/2016  . Breast cancer of upper-outer quadrant of left female breast ([place]) 08/30/2015  . 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 ABDOMINAL HYSTERECTOMY    . APPENDECTOMY    . CESAREAN SECTION    . CHOLECYSTECTOMY    . MOHS SURGERY  02/2017  . THYROID SURGERY    . TONSILLECTOMY       OB History   None      Home Medications    Prior to Admission medications   Medication Sig Start Date End Date Taking? Authorizing Provider  anastrozole (ARIMIDEX) 1 MG tablet Take 0.5 mg by mouth daily.  06/18/15  Yes [provider]  Calcium Carbonate-Vitamin D  (CALCIUM 600+D PO) Take 1 tablet by mouth daily.   Yes [provider]  carvedilol (COREG) 12.5 MG tablet Take 0.5 tablets by mouth 2 (two) times daily.   Yes [provider]  esomeprazole (NEXIUM) 40 MG capsule Take 40 mg by mouth daily as needed (heartburn).  12/24/16  Yes [provider]  telmisartan (MICARDIS) 80 MG tablet Take 80 mg by mouth daily. 05/22/15  Yes [provider]  Vitamin D, Ergocalciferol, (DRISDOL) 50000 units CAPS capsule Take 50,000 Units by mouth 2 (two) times a week. 07/30/16  Yes [provider]    Family History Family History  Problem Relation Age of Onset  . Heart failure Mother   . Heart attack Mother   . Hypertension Father   . Heart attack Maternal Grandmother     Social History Social History   Tobacco Use  . Smoking status: Never Smoker  . Smokeless tobacco: Never Used  Substance Use Topics  . Alcohol use: No  . Drug use: No     Allergies   Ciprofloxacin; Nitrofuran derivatives; Sulfa antibiotics; and Oxycodone   Review of Systems Review of Systems  Constitutional: Negative for chills and fever.  HENT: Negative for congestion.   Eyes: Negative for visual disturbance.  Respiratory: Negative for shortness of breath.   Cardiovascular: Positive for palpitations. Negative for chest pain.  Gastrointestinal: Negative for abdominal pain and vomiting.  Genitourinary: Negative for dysuria and flank pain.  Musculoskeletal: Negative for back pain, neck pain  and neck stiffness.  Skin: Negative for rash.  Neurological: Negative for light-headedness and headaches.     Physical Exam Updated Vital Signs BP (!) 161/79   Pulse 72   Temp 98 F (36.7 C) (Oral)   Resp 13   Ht 5\' 7"  (1.702 m)   Wt 64.4 kg (142 lb)   SpO2 97%   BMI 22.24 kg/m   Physical Exam  Constitutional: She is oriented to person, place, and time. She appears well-developed and well-nourished.  HENT:  Head: Normocephalic and  atraumatic.  Eyes: Conjunctivae are normal. Right eye exhibits no discharge. Left eye exhibits no discharge.  Neck: Normal range of motion. Neck supple. No tracheal deviation present.  Cardiovascular: Normal rate and regular rhythm.  Pulmonary/Chest: Effort normal and breath sounds normal.  Abdominal: Soft. She exhibits no distension. There is no tenderness. There is no guarding.  Musculoskeletal: She exhibits no edema.  Neurological: She is alert and oriented to person, place, and time.  Skin: Skin is warm. No rash noted.  Psychiatric: She has a normal mood and affect.  Nursing note and vitals reviewed.    ED Treatments / Results  Labs (all labs ordered are listed, but only abnormal results are displayed) Labs Reviewed  BASIC METABOLIC PANEL - Abnormal; Notable for the following components:      Result Value   Glucose, Bld 138 (*)    Creatinine, Ser 1.12 (*)    GFR calc non Af Amer 48 (*)    GFR calc Af Amer 55 (*)    All other components within normal limits  CBC  TSH  I-STAT TROPONIN, ED  I-STAT TROPONIN, ED    EKG EKG Interpretation  Date/Time:  Saturday Oct 23 2017 18:14:11 EDT Ventricular Rate:  100 PR Interval:  198 QRS Duration: 64 QT Interval:  344 QTC Calculation: 443 R Axis:   48 Text Interpretation:  Normal sinus rhythm Low voltage QRS artifact Confirmed by Elnora Morrison [phone]) on 10/23/2017 9:30:50 PM   Radiology Dg Chest 2 View  Result Date: 10/23/2017 CLINICAL DATA:  Chest pressure and palpitations beginning this afternoon. History of breast cancer. EXAM: CHEST - 2 VIEW COMPARISON:  Chest radiograph February 04, 2006 FINDINGS: Cardiomediastinal silhouette is normal. Calcified aortic knob. No pleural effusions or focal consolidations. Hyperinflation. Trachea projects midline and there is no pneumothorax. Soft tissue planes and included osseous structures are non-suspicious. Mild degenerative change of the thoracic spine. Asymmetrically smaller LEFT breast.  Surgical clips RIGHT neck, probable thyroidectomy. IMPRESSION: No acute cardiopulmonary process. Aortic Atherosclerosis (ICD10-I70.0). Electronically Signed   By: Elon Alas M.D.   On: 10/23/2017 19:00    Procedures Procedures (including critical care time)  Medications Ordered in ED Medications - No data to display   Initial Impression / Assessment and Plan / ED Course  I have reviewed the triage vital signs and the nursing notes.  Pertinent labs & imaging results that were available during my care of the patient were reviewed by me and considered in my medical decision making (see chart for details).    Patient presents after episodes of palpitations. No symptoms currently. Cardiac metabolic workup unremarkable blood work reviewed. Troponins and thyroid test negative. Discussed follow-up with cardiology and primary doctor for possible monitor.  Results and differential diagnosis were discussed with the patient/parent/guardian. Xrays were independently reviewed by myself.  Close follow up outpatient was discussed, comfortable with the plan.   Medications - No data to display  Vitals:   10/23/17 1824 10/23/17 2125  10/23/17 2215 10/23/17 2300  BP:  (!) 182/93 (!) 168/81 (!) 161/79  Pulse:  77 73 72  Resp:  16 15 13   Temp:      TempSrc:      SpO2:  100% 98% 97%  Weight: 64.4 kg (142 lb)     Height: 5\' 7"  (1.702 m)       Final diagnoses:  Heart palpitations     Final Clinical Impressions(s) / ED Diagnoses   Final diagnoses:  Heart palpitations    ED Discharge Orders    None       Elnora Morrison, MD 10/23/17 2308

## 2017-10-23 NOTE — ED Triage Notes (Addendum)
Pt states palpitations and chest pressure starting at 4 pm.  States took half a tab of coreg and it seemed to slow her hr.  Was sent here from [place].  Was given 4 81 mg asa at [facility].  PT also states has been feeling very hot since sensations.

## 2017-10-23 NOTE — Discharge Instructions (Signed)
If you were given medicines take as directed.  If you are on coumadin or contraceptives realize their levels and effectiveness is altered by many different medicines.  If you have any reaction (rash, tongues swelling, other) to the medicines stop taking and see a physician.    If your blood pressure was elevated in the ER make sure you follow up for management with a primary doctor or return for chest pain, shortness of breath or stroke symptoms.  Please follow up as directed and return to the ER or see a physician for new or worsening symptoms.  Thank you. Vitals:   10/23/17 1817 10/23/17 1824 10/23/17 2125  BP: (!) 164/104  (!) 182/93  Pulse: 100  77  Resp: 18  16  Temp: 98 F (36.7 C)    TempSrc: Oral    SpO2: 98%  100%  Weight: 64.4 kg (142 lb) 64.4 kg (142 lb)   Height: 5\' 7"  (1.702 m) 5\' 7"  (1.702 m)

## 2017-11-12 DIAGNOSIS — Z79811 Long term (current) use of aromatase inhibitors: Secondary | ICD-10-CM | POA: Diagnosis not present

## 2017-11-12 DIAGNOSIS — Z853 Personal history of malignant neoplasm of breast: Secondary | ICD-10-CM | POA: Diagnosis not present

## 2017-11-12 DIAGNOSIS — Z170 Estrogen receptor positive status [ER+]: Secondary | ICD-10-CM | POA: Diagnosis not present

## 2017-11-12 DIAGNOSIS — C50912 Malignant neoplasm of unspecified site of left female breast: Secondary | ICD-10-CM | POA: Diagnosis not present

## 2017-12-08 DIAGNOSIS — Z6822 Body mass index (BMI) 22.0-22.9, adult: Secondary | ICD-10-CM | POA: Diagnosis not present

## 2017-12-08 DIAGNOSIS — M549 Dorsalgia, unspecified: Secondary | ICD-10-CM | POA: Diagnosis not present

## 2017-12-08 DIAGNOSIS — M25559 Pain in unspecified hip: Secondary | ICD-10-CM | POA: Diagnosis not present

## 2018-01-10 DIAGNOSIS — R922 Inconclusive mammogram: Secondary | ICD-10-CM | POA: Diagnosis not present

## 2018-01-10 DIAGNOSIS — C50412 Malignant neoplasm of upper-outer quadrant of left female breast: Secondary | ICD-10-CM | POA: Diagnosis not present

## 2018-01-17 DIAGNOSIS — Z170 Estrogen receptor positive status [ER+]: Secondary | ICD-10-CM | POA: Diagnosis not present

## 2018-01-17 DIAGNOSIS — C50412 Malignant neoplasm of upper-outer quadrant of left female breast: Secondary | ICD-10-CM | POA: Diagnosis not present

## 2018-01-25 DIAGNOSIS — E1165 Type 2 diabetes mellitus with hyperglycemia: Secondary | ICD-10-CM | POA: Diagnosis not present

## 2018-01-25 DIAGNOSIS — Z0000 Encounter for general adult medical examination without abnormal findings: Secondary | ICD-10-CM | POA: Diagnosis not present

## 2018-01-25 DIAGNOSIS — Z6822 Body mass index (BMI) 22.0-22.9, adult: Secondary | ICD-10-CM | POA: Diagnosis not present

## 2018-01-25 DIAGNOSIS — R7989 Other specified abnormal findings of blood chemistry: Secondary | ICD-10-CM | POA: Diagnosis not present

## 2018-01-25 DIAGNOSIS — I10 Essential (primary) hypertension: Secondary | ICD-10-CM | POA: Diagnosis not present

## 2018-01-25 DIAGNOSIS — Z79899 Other long term (current) drug therapy: Secondary | ICD-10-CM | POA: Diagnosis not present

## 2018-01-25 DIAGNOSIS — E782 Mixed hyperlipidemia: Secondary | ICD-10-CM | POA: Diagnosis not present

## 2018-02-25 DIAGNOSIS — Z23 Encounter for immunization: Secondary | ICD-10-CM | POA: Diagnosis not present

## 2018-03-17 DIAGNOSIS — L821 Other seborrheic keratosis: Secondary | ICD-10-CM | POA: Diagnosis not present

## 2018-03-17 DIAGNOSIS — L720 Epidermal cyst: Secondary | ICD-10-CM | POA: Diagnosis not present

## 2018-03-17 DIAGNOSIS — D2261 Melanocytic nevi of right upper limb, including shoulder: Secondary | ICD-10-CM | POA: Diagnosis not present

## 2018-03-17 DIAGNOSIS — L918 Other hypertrophic disorders of the skin: Secondary | ICD-10-CM | POA: Diagnosis not present

## 2018-03-17 DIAGNOSIS — Z85828 Personal history of other malignant neoplasm of skin: Secondary | ICD-10-CM | POA: Diagnosis not present

## 2018-03-17 DIAGNOSIS — D220 Melanocytic nevi of lip: Secondary | ICD-10-CM | POA: Diagnosis not present

## 2018-03-17 DIAGNOSIS — D1722 Benign lipomatous neoplasm of skin and subcutaneous tissue of left arm: Secondary | ICD-10-CM | POA: Diagnosis not present

## 2018-03-17 DIAGNOSIS — D225 Melanocytic nevi of trunk: Secondary | ICD-10-CM | POA: Diagnosis not present

## 2018-03-17 DIAGNOSIS — D1801 Hemangioma of skin and subcutaneous tissue: Secondary | ICD-10-CM | POA: Diagnosis not present

## 2018-04-28 DIAGNOSIS — Z6822 Body mass index (BMI) 22.0-22.9, adult: Secondary | ICD-10-CM | POA: Diagnosis not present

## 2018-04-28 DIAGNOSIS — J40 Bronchitis, not specified as acute or chronic: Secondary | ICD-10-CM | POA: Diagnosis not present

## 2018-04-28 DIAGNOSIS — J329 Chronic sinusitis, unspecified: Secondary | ICD-10-CM | POA: Diagnosis not present

## 2018-06-21 DIAGNOSIS — C50412 Malignant neoplasm of upper-outer quadrant of left female breast: Secondary | ICD-10-CM | POA: Diagnosis not present

## 2018-08-17 DIAGNOSIS — R300 Dysuria: Secondary | ICD-10-CM | POA: Diagnosis not present

## 2018-08-17 DIAGNOSIS — Z6823 Body mass index (BMI) 23.0-23.9, adult: Secondary | ICD-10-CM | POA: Diagnosis not present

## 2018-09-09 DIAGNOSIS — N3001 Acute cystitis with hematuria: Secondary | ICD-10-CM | POA: Diagnosis not present

## 2018-09-09 DIAGNOSIS — N3090 Cystitis, unspecified without hematuria: Secondary | ICD-10-CM | POA: Diagnosis not present

## 2018-12-21 DIAGNOSIS — Z79811 Long term (current) use of aromatase inhibitors: Secondary | ICD-10-CM | POA: Diagnosis not present

## 2018-12-21 DIAGNOSIS — Z853 Personal history of malignant neoplasm of breast: Secondary | ICD-10-CM | POA: Diagnosis not present

## 2018-12-21 DIAGNOSIS — I890 Lymphedema, not elsewhere classified: Secondary | ICD-10-CM | POA: Diagnosis not present

## 2018-12-21 DIAGNOSIS — C50412 Malignant neoplasm of upper-outer quadrant of left female breast: Secondary | ICD-10-CM

## 2018-12-31 DIAGNOSIS — H00019 Hordeolum externum unspecified eye, unspecified eyelid: Secondary | ICD-10-CM | POA: Diagnosis not present

## 2019-01-09 ENCOUNTER — Other Ambulatory Visit: Payer: Self-pay

## 2019-01-12 DIAGNOSIS — R922 Inconclusive mammogram: Secondary | ICD-10-CM | POA: Diagnosis not present

## 2019-01-12 DIAGNOSIS — C50412 Malignant neoplasm of upper-outer quadrant of left female breast: Secondary | ICD-10-CM | POA: Diagnosis not present

## 2019-01-23 DIAGNOSIS — Z170 Estrogen receptor positive status [ER+]: Secondary | ICD-10-CM | POA: Diagnosis not present

## 2019-01-23 DIAGNOSIS — D1722 Benign lipomatous neoplasm of skin and subcutaneous tissue of left arm: Secondary | ICD-10-CM

## 2019-01-23 DIAGNOSIS — C50412 Malignant neoplasm of upper-outer quadrant of left female breast: Secondary | ICD-10-CM | POA: Diagnosis not present

## 2019-01-23 HISTORY — DX: Benign lipomatous neoplasm of skin and subcutaneous tissue of left arm: D17.22

## 2019-01-27 DIAGNOSIS — Z79899 Other long term (current) drug therapy: Secondary | ICD-10-CM | POA: Diagnosis not present

## 2019-01-27 DIAGNOSIS — Z6823 Body mass index (BMI) 23.0-23.9, adult: Secondary | ICD-10-CM | POA: Diagnosis not present

## 2019-01-27 DIAGNOSIS — E1165 Type 2 diabetes mellitus with hyperglycemia: Secondary | ICD-10-CM | POA: Diagnosis not present

## 2019-01-27 DIAGNOSIS — Z0000 Encounter for general adult medical examination without abnormal findings: Secondary | ICD-10-CM | POA: Diagnosis not present

## 2019-01-27 DIAGNOSIS — Z1331 Encounter for screening for depression: Secondary | ICD-10-CM | POA: Diagnosis not present

## 2019-01-27 DIAGNOSIS — I10 Essential (primary) hypertension: Secondary | ICD-10-CM | POA: Diagnosis not present

## 2019-03-24 DIAGNOSIS — L820 Inflamed seborrheic keratosis: Secondary | ICD-10-CM | POA: Diagnosis not present

## 2019-03-24 DIAGNOSIS — L821 Other seborrheic keratosis: Secondary | ICD-10-CM | POA: Diagnosis not present

## 2019-03-24 DIAGNOSIS — D1722 Benign lipomatous neoplasm of skin and subcutaneous tissue of left arm: Secondary | ICD-10-CM | POA: Diagnosis not present

## 2019-03-24 DIAGNOSIS — D225 Melanocytic nevi of trunk: Secondary | ICD-10-CM | POA: Diagnosis not present

## 2019-03-24 DIAGNOSIS — B078 Other viral warts: Secondary | ICD-10-CM | POA: Diagnosis not present

## 2019-03-24 DIAGNOSIS — Z85828 Personal history of other malignant neoplasm of skin: Secondary | ICD-10-CM | POA: Diagnosis not present

## 2019-03-24 DIAGNOSIS — D1801 Hemangioma of skin and subcutaneous tissue: Secondary | ICD-10-CM | POA: Diagnosis not present

## 2019-06-30 ENCOUNTER — Ambulatory Visit: Payer: Medicare Other | Admitting: Cardiology

## 2019-08-22 NOTE — Progress Notes (Signed)
Cardiology Office Note:    Date:  08/23/2019   ID:  Natalie Mack, DOB 11/10/1945, MRN [id number]  PCP:  Ronita Hipps, MD  Cardiologist:  Shirlee More, MD    Referring MD: Ronita Hipps, MD    ASSESSMENT:    1. Essential hypertension   2. Pure hypercholesterolemia    PLAN:    In order of problems listed above:  1. Poorly controlled continue beta-blocker and ARB try intermittent low-dose MRA and stroke.  Function potassium 2. Lipid profile rechecked likely need drug therapy   Next appointment: 6 months   Medication Adjustments/Labs and Tests Ordered: Current medicines are reviewed at length with the patient today.  Concerns regarding medicines are outlined above.  No orders of the defined types were placed in this encounter.  No orders of the defined types were placed in this encounter.   Chief Complaint  Patient presents with  . Follow-up  . Hypertension    History of Present Illness:    Natalie Mack is a 74 y.o. female with a hx of breast cancer with surgery, XRT and estrogrn blocker therapy and hypertension last seen in April 2018.Home BP is in range 130/80. She is off her diuretic.Spironolactone had  symptomatic hypotension with systolics less than 347. In the past she had hypokalemia with thiazide diuretic. She  last seen 02/02/2019. Compliance with diet, lifestyle and medications: Yes  Home blood pressures variable oftentimes is systolics up to the range of 170.  She has been intolerant of thiazide diuretic with hypokalemia spironolactone with hypotension we will place her on a small dose of Inspra 2 days a week which should bring her into target range.  She will continue her carvedilol and telmisartan recheck renal function potassium lipids today Past Medical History:  Diagnosis Date  . Breast cancer of upper-outer quadrant of left female breast ([place]) 08/30/2015  . Essential hypertension 09/19/2016  . 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  Hyperlipidemia 02/01/2017  . Hypokalemia 02/01/2017  . Murmur 01/27/2017  . Palpitation 09/19/2016   Overview:  Overview:  Rapid HR, not documented  . Statin intolerance 02/01/2017    Past Surgical History:  Procedure Laterality Date  . ABDOMINAL HYSTERECTOMY    . APPENDECTOMY    . CESAREAN SECTION    . CHOLECYSTECTOMY    . MOHS SURGERY  02/2017  . THYROID SURGERY    . TONSILLECTOMY      Current Medications: Current Meds  Medication Sig  . anastrozole (ARIMIDEX) 1 MG tablet Take 0.5 mg by mouth daily.   . carvedilol (COREG) 12.5 MG tablet Take 0.5 tablets by mouth 2 (two) times daily.  [name] Kitchen esomeprazole (NEXIUM) 40 MG capsule Take 40 mg by mouth daily as needed (heartburn).   [name] Kitchen telmisartan (MICARDIS) 80 MG tablet Take 80 mg by mouth daily.  . Vitamin D, Ergocalciferol, (DRISDOL) 50000 units CAPS capsule Take 50,000 Units by mouth 2 (two) times a week.     Allergies:   Ciprofloxacin, Nitrofuran derivatives, Sulfa antibiotics, and Oxycodone   Social History   Socioeconomic History  . Marital status: Married    Spouse name: Not on file  . Number of children: Not on file  . Years of education: Not on file  . Highest education level: Not on file  Occupational History  . Not on file  Tobacco Use  . Smoking status: Never Smoker  . Smokeless tobacco: Never Used  Substance and Sexual Activity  . Alcohol use: No  . Drug  use: No  . Sexual activity: Not on file  Other Topics Concern  . Not on file  Social History Narrative  . Not on file   Social Determinants of Health   Financial Resource Strain:   . Difficulty of Paying Living Expenses:   Food Insecurity:   . Worried About Charity fundraiser in the Last Year:   . Arboriculturist in the Last Year:   Transportation Needs:   . Film/video editor (Medical):   [name] Kitchen Lack of Transportation (Non-Medical):   Physical Activity:   . Days of Exercise per Week:   . Minutes of Exercise per Session:   Stress:   . Feeling of Stress  :   Social Connections:   . Frequency of Communication with Friends and Family:   . Frequency of Social Gatherings with Friends and Family:   . Attends Religious Services:   . Active Member of Clubs or Organizations:   . Attends Archivist Meetings:   [name] Kitchen Marital Status:      Family History: The patient's family history includes Heart attack in her maternal grandmother and mother; Heart failure in her mother; Hypertension in her father. ROS:   Please see the history of present illness.    All other systems reviewed and are negative.  EKGs/Labs/Other Studies Reviewed:    The following studies were reviewed today:  EKG:  EKG ordered today and personally reviewed.  The ekg ordered today demonstrates sinus rhythm QS in V2 otherwise normal EKG  Recent Labs: Last labs 01/27/2019 cholesterol 299 LDL 198 HDL 59 creatinine Recent Lipid Panel    Component Value Date/Time   CHOL 257 (H) 09/01/2017 0913   TRIG 147 09/01/2017 0913   HDL 59 09/01/2017 0913   CHOLHDL 4.4 09/01/2017 0913   LDLCALC 169 (H) 09/01/2017 0913    Physical Exam:    VS:  BP (!) 164/106   Pulse 84   Temp (!) 96.8 F (36 C)   Ht 5\' 7"  (1.702 m)   Wt 155 lb 12.8 oz (70.7 kg)   SpO2 97%   BMI 24.40 kg/m     Wt Readings from Last 3 Encounters:  08/23/19 155 lb 12.8 oz (70.7 kg)  10/23/17 142 lb (64.4 kg)  09/01/17 142 lb 12.8 oz (64.8 kg)     GEN:  Well nourished, well developed in no acute distress HEENT: Normal NECK: No JVD; No carotid bruits LYMPHATICS: No lymphadenopathy CARDIAC: RRR, no murmurs, rubs, gallops RESPIRATORY:  Clear to auscultation without rales, wheezing or rhonchi  ABDOMEN: Soft, non-tender, non-distended MUSCULOSKELETAL:  No edema; No deformity  SKIN: Warm and dry NEUROLOGIC:  Alert and oriented x 3 PSYCHIATRIC:  Normal affect    Signed, Shirlee More, MD  08/23/2019 2:18 PM    [facility]

## 2019-08-23 ENCOUNTER — Ambulatory Visit (INDEPENDENT_AMBULATORY_CARE_PROVIDER_SITE_OTHER): Payer: Medicare PPO | Admitting: Cardiology

## 2019-08-23 ENCOUNTER — Encounter: Payer: Self-pay | Admitting: Cardiology

## 2019-08-23 ENCOUNTER — Other Ambulatory Visit: Payer: Self-pay

## 2019-08-23 VITALS — BP 164/106 | HR 84 | Temp 96.8°F | Ht 67.0 in | Wt 155.8 lb

## 2019-08-23 DIAGNOSIS — I10 Essential (primary) hypertension: Secondary | ICD-10-CM | POA: Diagnosis not present

## 2019-08-23 DIAGNOSIS — E7800 Pure hypercholesterolemia, unspecified: Secondary | ICD-10-CM

## 2019-08-23 MED ORDER — EPLERENONE 25 MG PO TABS: 25.0000 mg | ORAL_TABLET | Freq: Every day | ORAL | 3 refills | Status: DC

## 2019-08-23 NOTE — Patient Instructions (Signed)
Medication Instructions:  Start Eplerenone (Inspra) 25 mg 2 days a week   *If you need a refill on your cardiac medications before your next appointment, please call your pharmacy*   Lab Work: [place] Today   If you have labs (blood work) drawn today and your tests are completely normal, you will receive your results only by: [name] Kitchen MyChart Message (if you have MyChart) OR . A paper copy in the mail If you have any lab test that is abnormal or we need to change your treatment, we will call you to review the results.   Testing/Procedures: None ordered    Follow-Up: At [facility], you and your health needs are our priority.  As part of our continuing mission to provide you with exceptional heart care, we have created designated Provider Care Teams.  These Care Teams include your primary Cardiologist (physician) and Advanced Practice Providers (APPs -  Physician Assistants and Nurse Practitioners) who all work together to provide you with the care you need, when you need it.  We recommend signing up for the patient portal called "MyChart".  Sign up information is provided on this After Visit Summary.  MyChart is used to connect with patients for Virtual Visits (Telemedicine).  Patients are able to view lab/test results, encounter notes, upcoming appointments, etc.  Non-urgent messages can be sent to your provider as well.   To learn more about what you can do with MyChart, go to [email or web address].    Your next appointment:   6 month(s)  The format for your next appointment:   In Person  Provider:   Shirlee More, MD   Other Instructions None

## 2019-08-24 ENCOUNTER — Other Ambulatory Visit: Payer: Self-pay | Admitting: Cardiology

## 2019-08-24 ENCOUNTER — Telehealth: Payer: Self-pay | Admitting: *Deleted

## 2019-08-24 DIAGNOSIS — E7800 Pure hypercholesterolemia, unspecified: Secondary | ICD-10-CM

## 2019-08-24 LAB — COMPREHENSIVE METABOLIC PANEL
ALT: 12 IU/L (ref 0–32)
AST: 20 IU/L (ref 0–40)
Albumin/Globulin Ratio: 2.4 — ABNORMAL HIGH (ref 1.2–2.2)
Albumin: 4.5 g/dL (ref 3.7–4.7)
Alkaline Phosphatase: 97 IU/L (ref 39–117)
BUN/Creatinine Ratio: 15 (ref 12–28)
BUN: 12 mg/dL (ref 8–27)
Bilirubin Total: 0.7 mg/dL (ref 0.0–1.2)
CO2: 25 mmol/L (ref 20–29)
Calcium: 9.3 mg/dL (ref 8.7–10.3)
Chloride: 100 mmol/L (ref 96–106)
Creatinine, Ser: 0.79 mg/dL (ref 0.57–1.00)
GFR calc Af Amer: 85 mL/min/{1.73_m2} (ref >59)
GFR calc non Af Amer: 73 mL/min/{1.73_m2} (ref >59)
Globulin, Total: 1.9 g/dL (ref 1.5–4.5)
Glucose: 115 mg/dL — ABNORMAL HIGH (ref 65–99)
Potassium: 3.9 mmol/L (ref 3.5–5.2)
Sodium: 137 mmol/L (ref 134–144)
Total Protein: 6.4 g/dL (ref 6.0–8.5)

## 2019-08-24 LAB — LIPID PANEL
Chol/HDL Ratio: 4 ratio (ref 0.0–4.4)
Cholesterol, Total: 240 mg/dL — ABNORMAL HIGH (ref 100–199)
HDL: 60 mg/dL (ref >39)
LDL Chol Calc (NIH): 167 mg/dL — ABNORMAL HIGH (ref 0–99)
Triglycerides: 74 mg/dL (ref 0–149)
VLDL Cholesterol Cal: 13 mg/dL (ref 5–40)

## 2019-08-24 MED ORDER — LIVALO 2 MG PO TABS: ORAL_TABLET | ORAL | 11 refills | Status: DC

## 2019-08-24 NOTE — Telephone Encounter (Signed)
-----   Message from Natalie Priest, MD sent at 08/24/2019 11:37 AM EDT ----- Normal or stable result  Good result except lipids her LDL is quite high which she consider taking a small dose of a low potency statin Livalo 2 mg daily she had muscle symptoms with the reduce it to Monday Wednesday Friday if she agrees recheck her lipids in 2 months

## 2019-08-24 NOTE — Telephone Encounter (Signed)
Pt aware of her lab results.  Also, Dr. Bettina Gavia,,  Pt said her pharmacy called her yesterday re: Inspra and they had a concern with it interacting with Carvedilol and Telmisartan?  Pt states if it's going to cause problems, then she don't want to take it. Please advise!

## 2019-08-24 NOTE — Telephone Encounter (Signed)
I think it is a good choice for her  at a small dose

## 2019-08-24 NOTE — Telephone Encounter (Signed)
Called pt back to make her aware that per Dr. Bettina Gavia, he thinks that Inspra is a good choice for her at the small dose that he prescribed.  Pt verbalized understanding and thanked me for calling her back.   Called the pharmacy, per pt request, to see what the concern was with Inspra and Telmisartan, spoke with Pharmacist, Mia, the interaction with it could cause Hyperkalemia.    I did advise her to go ahead and fill it, per Dr.Munley, and I will make him aware.

## 2019-10-06 ENCOUNTER — Telehealth: Payer: Self-pay | Admitting: Cardiology

## 2019-10-06 NOTE — Telephone Encounter (Signed)
Pt c/o medication issue:  1. Name of Medication: eplerenone (INSPRA) 25 MG tablet  2. How are you currently taking this medication (dosage and times per day)? As directed  3. Are you having a reaction (difficulty breathing--STAT)? yes  4. What is your medication issue? Patient states that ever since she started this medication she has been hot and sweaty. She states that this is unlike her. She wants to know if she should come off the medication or cut it in half. Please advise.

## 2019-10-06 NOTE — Telephone Encounter (Signed)
Called the patient to advise on Dr. Oren Binet recommendation to only take half the pill so 12.5 mg daily of INSPRA rather than the original 25 mg daily. Pt verbalized understanding. I advised the patient to try this new dose for a week and give [place] a call back to let us know how it is working for her. She verbalized understanding and is aware to call back with any new concerns.

## 2019-10-06 NOTE — Telephone Encounter (Signed)
I would reduce by 50%

## 2019-11-01 ENCOUNTER — Other Ambulatory Visit: Payer: Self-pay | Admitting: Vascular Surgery

## 2019-11-01 DIAGNOSIS — Z9889 Other specified postprocedural states: Secondary | ICD-10-CM

## 2019-12-21 DIAGNOSIS — C50412 Malignant neoplasm of upper-outer quadrant of left female breast: Secondary | ICD-10-CM

## 2020-01-18 ENCOUNTER — Ambulatory Visit
Admission: RE | Admit: 2020-01-18 | Discharge: 2020-01-18 | Disposition: A | Discharge status: Home / Self Care | Payer: Medicare PPO | Source: Ambulatory Visit | Attending: Vascular Surgery | Admitting: Vascular Surgery

## 2020-01-18 ENCOUNTER — Other Ambulatory Visit: Payer: Self-pay

## 2020-01-18 DIAGNOSIS — Z9889 Other specified postprocedural states: Secondary | ICD-10-CM

## 2020-04-08 NOTE — Progress Notes (Signed)
Cardiology Office Note:    Date:  04/09/2020   ID:  Natalie Mack, DOB 1945/08/07, MRN [id number]  PCP:  Ronita Hipps, MD  Cardiologist:  Shirlee More, MD    Referring MD: Ronita Hipps, MD    ASSESSMENT:    1. Essential hypertension   2. Pure hypercholesterolemia    PLAN:    In order of problems listed above:  1. She continues to have labile hypertension and drug intolerances.  We will resume a thiazide diuretic address lifestyle with exercise and weight loss check renal function in 2 weeks and reassess in the office in 3 months.  If still not in goal calcium channel blocker or centrally active clonidine would be a good choice and she will continue her ARB telmisartan and beta-blocker carvedilol.   Next appointment: 3 months   Medication Adjustments/Labs and Tests Ordered: Current medicines are reviewed at length with the patient today.  Concerns regarding medicines are outlined above.  No orders of the defined types were placed in this encounter.  No orders of the defined types were placed in this encounter.   No chief complaint on file.   History of Present Illness:    Natalie Mack is a 74 y.o. female with a hx of hypertension and hyperlipidemia last seen 08/23/2019. Compliance with diet, lifestyle and medications: Yes  Last visit I placed her on MRA Inspra and she was intolerant with night sweats and trouble sleeping and worsening tremor.  Home blood pressure is variable at times less than 100 at times greater than 180 but typically runs 120-170/70-80.  Previously when she took a thiazide diuretic she is well controlled however she was exercising lost weight and had symptomatic hypotension.  We discussed alternatives for treatment including adding an additional agent like calcium channel blocker or centrally active clonidine or restarting a thiazide diuretic lifestyle change weight loss and exercise and if she is getting systolic blood pressures of less than 115  to stop her diuretic in the future.  She will follow blood pressures at home for 2 weeks, and have renal function checked and drop that list off in my office.  She had a cerebral vascular duplex done at [facility] 01/22/2020 showing mild plaque at the carotid bifurcation bilaterally but less than 50% stenosis.  She also had an MRI of the head at the same time which was normal.  The indication was visual changes. Past Medical History:  Diagnosis Date  . 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 Breast cancer of upper-outer quadrant of left female breast ([place]) 08/30/2015  . Essential hypertension 09/19/2016  . 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 Hyperlipidemia 02/01/2017  . Hypokalemia 02/01/2017  . Murmur 01/27/2017  . Palpitation 09/19/2016   Overview:  Overview:  Rapid HR, not documented  . Statin intolerance 02/01/2017    Past Surgical History:  Procedure Laterality Date  . ABDOMINAL HYSTERECTOMY    . APPENDECTOMY    . CESAREAN SECTION    . CHOLECYSTECTOMY    . MOHS SURGERY  02/2017  . THYROID SURGERY    . TONSILLECTOMY      Current Medications: Current Meds  Medication Sig  . anastrozole (ARIMIDEX) 1 MG tablet Take 0.5 mg by mouth daily.   . Calcium 600-200 MG-UNIT tablet Take 1 tablet by mouth daily.  . carvedilol (COREG) 12.5 MG tablet Take 12.5 mg by mouth in the morning and at bedtime.   [name] Kitchen esomeprazole (  NEXIUM) 40 MG capsule Take 40 mg by mouth daily as needed (heartburn).   [name] Kitchen telmisartan (MICARDIS) 80 MG tablet Take 80 mg by mouth daily.     Allergies:   Ciprofloxacin, Nitrofuran derivatives, Sulfa antibiotics, and Oxycodone   Social History   Socioeconomic History  . Marital status: Married    Spouse name: Not on file  . Number of children: Not on file  . Years of education: Not on file  . Highest education level: Not on file  Occupational History  . Not on file  Tobacco Use  . Smoking status: Never  Smoker  . Smokeless tobacco: Never Used  Vaping Use  . Vaping Use: Never used  Substance and Sexual Activity  . Alcohol use: No  . Drug use: No  . Sexual activity: Not on file  Other Topics Concern  . Not on file  Social History Narrative  . Not on file   Social Determinants of Health   Financial Resource Strain:   . Difficulty of Paying Living Expenses: Not on file  Food Insecurity:   . Worried About Charity fundraiser in the Last Year: Not on file  . Ran Out of Food in the Last Year: Not on file  Transportation Needs:   . Lack of Transportation (Medical): Not on file  . Lack of Transportation (Non-Medical): Not on file  Physical Activity:   . Days of Exercise per Week: Not on file  . Minutes of Exercise per Session: Not on file  Stress:   . Feeling of Stress : Not on file  Social Connections:   . Frequency of Communication with Friends and Family: Not on file  . Frequency of Social Gatherings with Friends and Family: Not on file  . Attends Religious Services: Not on file  . Active Member of Clubs or Organizations: Not on file  . Attends Archivist Meetings: Not on file  . Marital Status: Not on file     Family History: The patient's family history includes Heart attack in her maternal grandmother and mother; Heart failure in her mother; Hypertension in her father. ROS:   Please see the history of present illness.    All other systems reviewed and are negative.  EKGs/Labs/Other Studies Reviewed:    The following studies were reviewed today:    Recent Labs: 08/23/2019: ALT 12; BUN 12; Creatinine, Ser 0.79; Potassium 3.9; Sodium 137  Recent Lipid Panel    Component Value Date/Time   CHOL 240 (H) 08/23/2019 1427   TRIG 74 08/23/2019 1427   HDL 60 08/23/2019 1427   CHOLHDL 4.0 08/23/2019 1427   LDLCALC 167 (H) 08/23/2019 1427    Physical Exam:    VS:  BP (!) 200/106   Pulse 76   Ht 5\' 7"  (1.702 m)   Wt 154 lb (69.9 kg)   SpO2 96%   BMI 24.12  kg/m     Wt Readings from Last 3 Encounters:  04/09/20 154 lb (69.9 kg)  08/23/19 155 lb 12.8 oz (70.7 kg)  10/23/17 142 lb (64.4 kg)    Repeat blood pressure by me twice right arm sitting resting 170/80 and 172/80 GEN:  Well nourished, well developed in no acute distress HEENT: Normal NECK: No JVD; No carotid bruits LYMPHATICS: No lymphadenopathy CARDIAC: RRR, no murmurs, rubs, gallops RESPIRATORY:  Clear to auscultation without rales, wheezing or rhonchi  ABDOMEN: Soft, non-tender, non-distended MUSCULOSKELETAL:  No edema; No deformity  SKIN: Warm and dry NEUROLOGIC:  Alert and  oriented x 3 PSYCHIATRIC:  Normal affect    Signed, Shirlee More, MD  04/09/2020 2:41 PM    [place]

## 2020-04-09 ENCOUNTER — Ambulatory Visit: Payer: Medicare PPO | Admitting: Cardiology

## 2020-04-09 ENCOUNTER — Encounter: Payer: Self-pay | Admitting: Cardiology

## 2020-04-09 ENCOUNTER — Other Ambulatory Visit: Payer: Self-pay

## 2020-04-09 VITALS — BP 200/106 | HR 76 | Ht 67.0 in | Wt 154.0 lb

## 2020-04-09 DIAGNOSIS — E7800 Pure hypercholesterolemia, unspecified: Secondary | ICD-10-CM | POA: Diagnosis not present

## 2020-04-09 DIAGNOSIS — I10 Essential (primary) hypertension: Secondary | ICD-10-CM

## 2020-04-09 NOTE — Addendum Note (Signed)
Addended by: Resa Miner I on: 04/09/2020 02:46 PM   Modules accepted: Orders

## 2020-04-09 NOTE — Patient Instructions (Signed)
Medication Instructions:  Your physician has recommended you make the following change in your medication:  START: Hydrochlorothiazide 12.5 mg take one tablet by mouth daily. Hold this medication if your systolic (top) blood pressure is less than 115.  *If you need a refill on your cardiac medications before your next appointment, please call your pharmacy*   Lab Work: Your physician recommends that you return for lab work in: 2 weeks BMP If you have labs (blood work) drawn today and your tests are completely normal, you will receive your results only by: [name] Kitchen MyChart Message (if you have MyChart) OR . A paper copy in the mail If you have any lab test that is abnormal or we need to change your treatment, we will call you to review the results.   Testing/Procedures: None   Follow-Up: At [facility], you and your health needs are our priority.  As part of our continuing mission to provide you with exceptional heart care, we have created designated Provider Care Teams.  These Care Teams include your primary Cardiologist (physician) and Advanced Practice Providers (APPs -  Physician Assistants and Nurse Practitioners) who all work together to provide you with the care you need, when you need it.  We recommend signing up for the patient portal called "MyChart".  Sign up information is provided on this After Visit Summary.  MyChart is used to connect with patients for Virtual Visits (Telemedicine).  Patients are able to view lab/test results, encounter notes, upcoming appointments, etc.  Non-urgent messages can be sent to your provider as well.   To learn more about what you can do with MyChart, go to [email or web address].    Your next appointment:   3 month(s)  The format for your next appointment:   In Person  Provider:   Shirlee More, MD   Other Instructions

## 2020-04-10 ENCOUNTER — Telehealth: Payer: Self-pay | Admitting: Cardiology

## 2020-04-10 MED ORDER — HYDROCHLOROTHIAZIDE 12.5 MG PO CAPS: 12.5000 mg | ORAL_CAPSULE | Freq: Every day | ORAL | 3 refills | Status: DC

## 2020-04-10 NOTE — Telephone Encounter (Signed)
New Message:     Pt saw Dr Bettina Gavia yesterday and said he prescribed a new medicine. She said when she checked with the pharmacist they did not have it. If she is not at home, please leave her a message.

## 2020-04-10 NOTE — Telephone Encounter (Signed)
I am acting in triage role today to assist with patient callbacks.    Per office note yesterday, Dr. Bettina Gavia had advised patient start HCTZ 12.5mg  daily with f/u BMET in 2 weeks. Do not see med was sent in. Will send in to [phone] with refills per patient request. She was also provided instructions for reaching back out if she notices her BP continuing to run elevated even after new medicine change. Per OV note yesterday she was also provided instructions yesterday of calling if SBP <115. The patient verbalized understanding and gratitude. Adrielle Polakowski PA-C

## 2020-04-25 ENCOUNTER — Other Ambulatory Visit: Payer: Self-pay

## 2020-04-25 DIAGNOSIS — I10 Essential (primary) hypertension: Secondary | ICD-10-CM | POA: Diagnosis not present

## 2020-04-25 DIAGNOSIS — E7800 Pure hypercholesterolemia, unspecified: Secondary | ICD-10-CM

## 2020-04-25 LAB — BASIC METABOLIC PANEL
BUN/Creatinine Ratio: 18 (ref 12–28)
BUN: 14 mg/dL (ref 8–27)
CO2: 26 mmol/L (ref 20–29)
Calcium: 9.2 mg/dL (ref 8.7–10.3)
Chloride: 94 mmol/L — ABNORMAL LOW (ref 96–106)
Creatinine, Ser: 0.78 mg/dL (ref 0.57–1.00)
GFR calc Af Amer: 86 mL/min/{1.73_m2} (ref >59)
GFR calc non Af Amer: 75 mL/min/{1.73_m2} (ref >59)
Glucose: 111 mg/dL — ABNORMAL HIGH (ref 65–99)
Potassium: 4.2 mmol/L (ref 3.5–5.2)
Sodium: 131 mmol/L — ABNORMAL LOW (ref 134–144)

## 2020-04-26 ENCOUNTER — Telehealth: Payer: Self-pay | Admitting: Cardiology

## 2020-04-26 NOTE — Telephone Encounter (Signed)
Spoke with patient regarding results and recommendation.  Patient verbalizes understanding and is agreeable to plan of care. Advised patient to call back with any issues or concerns.  

## 2020-04-26 NOTE — Telephone Encounter (Signed)
Patient returning call for results for test done on 04/25/20. Please call/advise.   Thank you!

## 2020-05-09 ENCOUNTER — Other Ambulatory Visit: Payer: Self-pay

## 2020-05-09 DIAGNOSIS — E7800 Pure hypercholesterolemia, unspecified: Secondary | ICD-10-CM

## 2020-05-09 DIAGNOSIS — I10 Essential (primary) hypertension: Secondary | ICD-10-CM | POA: Diagnosis not present

## 2020-05-09 LAB — BASIC METABOLIC PANEL
BUN/Creatinine Ratio: 20 (ref 12–28)
BUN: 16 mg/dL (ref 8–27)
CO2: 25 mmol/L (ref 20–29)
Calcium: 9.1 mg/dL (ref 8.7–10.3)
Chloride: 100 mmol/L (ref 96–106)
Creatinine, Ser: 0.79 mg/dL (ref 0.57–1.00)
GFR calc Af Amer: 85 mL/min/{1.73_m2} (ref >59)
GFR calc non Af Amer: 73 mL/min/{1.73_m2} (ref >59)
Glucose: 105 mg/dL — ABNORMAL HIGH (ref 65–99)
Potassium: 4 mmol/L (ref 3.5–5.2)
Sodium: 137 mmol/L (ref 134–144)

## 2020-05-10 ENCOUNTER — Telehealth: Payer: Self-pay

## 2020-05-10 NOTE — Telephone Encounter (Signed)
Spoke with patient regarding results and recommendation.  Patient verbalizes understanding and is agreeable to plan of care. Advised patient to call back with any issues or concerns.  

## 2020-05-10 NOTE — Telephone Encounter (Signed)
-----   Message from Nuala Alpha, LPN sent at 87/10/6431 12:54 PM EST -----  ----- Message ----- From: Richardo Priest, MD Sent: 05/10/2020  12:36 PM EST To: [facility]/Hp Triage  Good result no changes

## 2020-06-21 ENCOUNTER — Telehealth: Payer: Self-pay

## 2020-06-21 NOTE — Telephone Encounter (Signed)
Denied today Before the requested drug can be approved, step therapy is required (trying other medicines first before stepping up to a drug that costs more). The following coverage rules must be met before the requested drug can be approved: has tried or cannot use both of the following: Zypitamag AND ezetimibe.

## 2020-06-21 NOTE — Telephone Encounter (Signed)
PA started for Livalo on CMM. [place]

## 2020-06-21 NOTE — Telephone Encounter (Signed)
I have started an appeal per Dr. Joya Gaskins recommendations for the patient.

## 2020-06-21 NOTE — Telephone Encounter (Signed)
She has carotid artery disease atherosclerosis on imaging and a statin is appropriate for these patients.  I do not think she needs to be treated with Zetia first

## 2020-07-11 DIAGNOSIS — B078 Other viral warts: Secondary | ICD-10-CM | POA: Diagnosis not present

## 2020-07-11 DIAGNOSIS — Z85828 Personal history of other malignant neoplasm of skin: Secondary | ICD-10-CM | POA: Diagnosis not present

## 2020-07-11 DIAGNOSIS — D225 Melanocytic nevi of trunk: Secondary | ICD-10-CM | POA: Diagnosis not present

## 2020-07-11 DIAGNOSIS — D1722 Benign lipomatous neoplasm of skin and subcutaneous tissue of left arm: Secondary | ICD-10-CM | POA: Diagnosis not present

## 2020-07-11 DIAGNOSIS — D1801 Hemangioma of skin and subcutaneous tissue: Secondary | ICD-10-CM | POA: Diagnosis not present

## 2020-07-11 DIAGNOSIS — C44519 Basal cell carcinoma of skin of other part of trunk: Secondary | ICD-10-CM | POA: Diagnosis not present

## 2020-07-11 DIAGNOSIS — C4441 Basal cell carcinoma of skin of scalp and neck: Secondary | ICD-10-CM | POA: Diagnosis not present

## 2020-07-11 DIAGNOSIS — L821 Other seborrheic keratosis: Secondary | ICD-10-CM | POA: Diagnosis not present

## 2020-07-11 DIAGNOSIS — D485 Neoplasm of uncertain behavior of skin: Secondary | ICD-10-CM | POA: Diagnosis not present

## 2020-07-11 DIAGNOSIS — L858 Other specified epidermal thickening: Secondary | ICD-10-CM | POA: Diagnosis not present

## 2020-07-15 NOTE — Progress Notes (Signed)
Cardiology Office Note:    Date:  07/16/2020   ID:  Natalie Mack, DOB March 14, 1946, MRN [id number]  PCP:  Ronita Hipps, MD  Cardiologist:  Shirlee More, MD    Referring MD: Ronita Hipps, MD    ASSESSMENT:    1. Essential hypertension   2. Pure hypercholesterolemia   3. Statin intolerance    PLAN:    In order of problems listed above:  1. Generally BP is in range I think she do better she took a small dose of her thiazide diuretic daily we will switch her to 8 tablets so she can fracture and a half and continue beta-blocker telmisartan trend blood pressure. 2. I offered Zetia therapy she declined   Next appointment: 6 months   Medication Adjustments/Labs and Tests Ordered: Current medicines are reviewed at length with the patient today.  Concerns regarding medicines are outlined above.  Orders Placed This Encounter  Procedures  . Basic metabolic panel   Meds ordered this encounter  Medications  . hydrochlorothiazide (MICROZIDE) 12.5 MG capsule    Sig: Take 1 capsule (12.5 mg total) by mouth daily.    Dispense:  30 capsule    Refill:  3    Chief Complaint  Patient presents with  . Follow-up  . Hypertension    History of Present Illness:    Natalie Mack is a 75 y.o. female with a hx of hypertension and hyperlipidemia last seen 04/09/2020.  She has increased cardiovascular risk with a history of breast cancer surgery XRT and hormonal estrogen blocker therapy.  Previously she had hypokalemia thiazide diuretic and symptomatic hypotension with spironolactone.  Was severely elevated she has declined lipid-lowering therapy with a statin. Compliance with diet, lifestyle and medications: Yes  She trends blood pressure at home and generally it is in range but intermittently greater than 076 systolic she is taking her diuretic every other day ordered a switch to a tablet so she can take it daily and continue carvedilol and telmisartan and recheck renal function today.  I  reviewed her records it was a PCSK9 inhibitor that the benefit manager would not give her and encouraged Zetia usage as she is statin intolerant.  No edema chest pain shortness of breath palpitation Past Medical History:  Diagnosis Date  . 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 Breast cancer of upper-outer quadrant of left female breast ([place]) 08/30/2015  . Essential hypertension 09/19/2016  . 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 Hyperlipidemia 02/01/2017  . Hypokalemia 02/01/2017  . Murmur 01/27/2017  . Palpitation 09/19/2016   Overview:  Overview:  Rapid HR, not documented  . Statin intolerance 02/01/2017    Past Surgical History:  Procedure Laterality Date  . ABDOMINAL HYSTERECTOMY    . APPENDECTOMY    . CESAREAN SECTION    . CHOLECYSTECTOMY    . MOHS SURGERY  02/2017  . THYROID SURGERY    . TONSILLECTOMY      Current Medications: Current Meds  Medication Sig  . Calcium 600-200 MG-UNIT tablet Take 1 tablet by mouth daily.  . carvedilol (COREG) 12.5 MG tablet Take 12.5 mg by mouth in the morning and at bedtime.   [name] Kitchen telmisartan (MICARDIS) 80 MG tablet Take 80 mg by mouth daily.  . [DISCONTINUED] hydrochlorothiazide (MICROZIDE) 12.5 MG capsule Take 1 capsule (12.5 mg total) by mouth daily. (Patient taking differently: Take 12.5 mg by mouth every other day.)     Allergies:  Ciprofloxacin, Nitrofuran derivatives, Sulfa antibiotics, and Oxycodone   Social History   Socioeconomic History  . Marital status: Married    Spouse name: Not on file  . Number of children: Not on file  . Years of education: Not on file  . Highest education level: Not on file  Occupational History  . Not on file  Tobacco Use  . Smoking status: Never Smoker  . Smokeless tobacco: Never Used  Vaping Use  . Vaping Use: Never used  Substance and Sexual Activity  . Alcohol use: No  . Drug use: No  . Sexual activity: Not on file  Other Topics  Concern  . Not on file  Social History Narrative  . Not on file   Social Determinants of Health   Financial Resource Strain: Not on file  Food Insecurity: Not on file  Transportation Needs: Not on file  Physical Activity: Not on file  Stress: Not on file  Social Connections: Not on file     Family History: The patient's family history includes Heart attack in her maternal grandmother and mother; Heart failure in her mother; Hypertension in her father. ROS:   Please see the history of present illness.    All other systems reviewed and are negative.  EKGs/Labs/Other Studies Reviewed:    The following studies were reviewed today:   Recent Labs: 08/23/2019: ALT 12 05/09/2020: BUN 16; Creatinine, Ser 0.79; Potassium 4.0; Sodium 137  Recent Lipid Panel    Component Value Date/Time   CHOL 240 (H) 08/23/2019 1427   TRIG 74 08/23/2019 1427   HDL 60 08/23/2019 1427   CHOLHDL 4.0 08/23/2019 1427   LDLCALC 167 (H) 08/23/2019 1427    Physical Exam:    VS:  BP (!) 160/80   Pulse 72   Ht 5\' 7"  (1.702 m)   Wt 154 lb (69.9 kg)   SpO2 98%   BMI 24.12 kg/m     Wt Readings from Last 3 Encounters:  07/16/20 154 lb (69.9 kg)  04/09/20 154 lb (69.9 kg)  08/23/19 155 lb 12.8 oz (70.7 kg)     GEN: Appears her age well nourished, well developed in no acute distress HEENT: Normal NECK: No JVD; No carotid bruits LYMPHATICS: No lymphadenopathy CARDIAC: RRR, no murmurs, rubs, gallops RESPIRATORY:  Clear to auscultation without rales, wheezing or rhonchi  ABDOMEN: Soft, non-tender, non-distended MUSCULOSKELETAL:  No edema; No deformity  SKIN: Warm and dry NEUROLOGIC:  Alert and oriented x 3 PSYCHIATRIC:  Normal affect    Signed, Shirlee More, MD  07/16/2020 1:28 PM    [facility]

## 2020-07-16 ENCOUNTER — Encounter: Payer: Self-pay | Admitting: Cardiology

## 2020-07-16 ENCOUNTER — Ambulatory Visit: Payer: Medicare PPO | Admitting: Cardiology

## 2020-07-16 ENCOUNTER — Other Ambulatory Visit: Payer: Self-pay

## 2020-07-16 VITALS — BP 160/80 | HR 72 | Ht 67.0 in | Wt 154.0 lb

## 2020-07-16 DIAGNOSIS — Z789 Other specified health status: Secondary | ICD-10-CM

## 2020-07-16 DIAGNOSIS — I10 Essential (primary) hypertension: Secondary | ICD-10-CM | POA: Diagnosis not present

## 2020-07-16 DIAGNOSIS — E7800 Pure hypercholesterolemia, unspecified: Secondary | ICD-10-CM

## 2020-07-16 MED ORDER — HYDROCHLOROTHIAZIDE 12.5 MG PO CAPS: 12.5000 mg | ORAL_CAPSULE | Freq: Every day | ORAL | 3 refills | Status: DC

## 2020-07-16 MED ORDER — HYDROCHLOROTHIAZIDE 12.5 MG PO TABS: 12.5000 mg | ORAL_TABLET | Freq: Every day | ORAL | 3 refills | Status: DC

## 2020-07-16 NOTE — Patient Instructions (Signed)
Medication Instructions:  Your physician has recommended you make the following change in your medication:  INCREASE: Please take your HCTZ 12.5 mg 1/2 tablet daily.  *If you need a refill on your cardiac medications before your next appointment, please call your pharmacy*   Lab Work: Your physician recommends that you return for lab work in: TODAY BMP If you have labs (blood work) drawn today and your tests are completely normal, you will receive your results only by: [name] Kitchen MyChart Message (if you have MyChart) OR . A paper copy in the mail If you have any lab test that is abnormal or we need to change your treatment, we will call you to review the results.   Testing/Procedures: None   Follow-Up: At [facility], you and your health needs are our priority.  As part of our continuing mission to provide you with exceptional heart care, we have created designated Provider Care Teams.  These Care Teams include your primary Cardiologist (physician) and Advanced Practice Providers (APPs -  Physician Assistants and Nurse Practitioners) who all work together to provide you with the care you need, when you need it.  We recommend signing up for the patient portal called "MyChart".  Sign up information is provided on this After Visit Summary.  MyChart is used to connect with patients for Virtual Visits (Telemedicine).  Patients are able to view lab/test results, encounter notes, upcoming appointments, etc.  Non-urgent messages can be sent to your provider as well.   To learn more about what you can do with MyChart, go to [email or web address].    Your next appointment:   6 month(s)  The format for your next appointment:   In Person  Provider:   Shirlee More, MD   Other Instructions

## 2020-07-17 ENCOUNTER — Telehealth: Payer: Self-pay

## 2020-07-17 LAB — BASIC METABOLIC PANEL
BUN/Creatinine Ratio: 16 (ref 12–28)
BUN: 10 mg/dL (ref 8–27)
CO2: 26 mmol/L (ref 20–29)
Calcium: 9.5 mg/dL (ref 8.7–10.3)
Chloride: 94 mmol/L — ABNORMAL LOW (ref 96–106)
Creatinine, Ser: 0.63 mg/dL (ref 0.57–1.00)
GFR calc Af Amer: 101 mL/min/{1.73_m2} (ref >59)
GFR calc non Af Amer: 87 mL/min/{1.73_m2} (ref >59)
Glucose: 106 mg/dL — ABNORMAL HIGH (ref 65–99)
Potassium: 4 mmol/L (ref 3.5–5.2)
Sodium: 132 mmol/L — ABNORMAL LOW (ref 134–144)

## 2020-07-17 NOTE — Telephone Encounter (Signed)
-----   Message from Richardo Priest, MD sent at 07/17/2020 12:54 PM EST ----- Stable no changes

## 2020-07-17 NOTE — Telephone Encounter (Signed)
Left message on patients voicemail to please return our call.   

## 2020-07-23 ENCOUNTER — Encounter: Payer: Self-pay | Admitting: Hematology and Oncology

## 2020-07-23 ENCOUNTER — Other Ambulatory Visit: Payer: Self-pay

## 2020-07-23 ENCOUNTER — Telehealth: Payer: Self-pay | Admitting: Oncology

## 2020-07-23 ENCOUNTER — Inpatient Hospital Stay: Payer: Medicare PPO | Attending: Hematology and Oncology | Admitting: Hematology and Oncology

## 2020-07-23 VITALS — BP 185/89 | HR 68 | Temp 98.1°F | Resp 18 | Ht 67.0 in | Wt 154.0 lb

## 2020-07-23 DIAGNOSIS — Z170 Estrogen receptor positive status [ER+]: Secondary | ICD-10-CM | POA: Diagnosis not present

## 2020-07-23 DIAGNOSIS — C50412 Malignant neoplasm of upper-outer quadrant of left female breast: Secondary | ICD-10-CM

## 2020-07-23 NOTE — Telephone Encounter (Signed)
Per 2/15 LOS, patient scheduled for Feb 2023 Follow Up Appt.  Gave patient Appt Summary

## 2020-07-23 NOTE — Progress Notes (Signed)
[facility]  [address] [place],  [place]  [address] [phone]  Clinic Day:  07/23/2020  Referring physician: Ronita Hipps, MD   CHIEF COMPLAINT:  CC: Stage IA hormone receptor positive breast cancer  Current Treatment:  Observation   HISTORY OF PRESENT ILLNESS:  [name] is a 75 y.o. 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  She was treated with lumpectomy.  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  2 sentinel nodes were negative for metastasis.  A medial margin was positive, so she underwent re-excision, which did not reveal any residual cancer.  Estrogen and progesterone receptors were positive and HER 2/Neu negative.  Ki 67 was 5%.  We discussed the option of Oncotype DX, but she felt she would not be interested in chemotherapy regardless, so this was not pursued.  She received adjuvant radiation to the left breast completed in November 2016.  Bone density scan in July 2016 was normal.  She was placed on anastrozole 1 mg daily in December 2016.  She had difficulty tolerating anastrozole 1 mg daily due to arthralgias, vaginal pressure and non refreshing sleep.  She discontinued anastrozole after about 2 months, then later started taking half a pill daily, which she has tolerated fairly well.  Repeat bone density scan in August 2018 remained normal, with just slight worsening since 2008.  The patient continues on calcium and vitamin-D.  Bilateral diagnostic mammogram in August 2020 did not reveal any evidence of malignancy. She completed 5 years of anastrozole at 50% dose in December.  She has an enlarging lipoma that is 3-4 cm in diameter in the left forearm, but on the palmar surface of her wrist, she had a cyst measuring 2 cm long, which has enlarged. We  recommended excision when we saw her in July. Bilateral diagnostic mammogram in August 2021 did not reveal any evidence of malignancy. Screening mammogram in 1 year was recommended.  INTERVAL HISTORY:  Natalie Mack is here today for repeat examination and states she has been doing well.  She denies any changes in her breasts.  She has had 2 new basal cell carcinomas of the skin of her back and scalp diagnosis and needs excision of these. She denies fevers or chills. She  denies pain. Her appetite is good. Her weight has been stable.  She continues to follow up with Dr. Noberto Retort and will see him again in August with her annual mammogram.  She has not had the cyst of the left wrist excised and it still seems to be growing slowly.  She states Dr. Noberto Retort is monitoring this as well. She has routine labs drawn at her primary care office.  REVIEW OF SYSTEMS:  Review of Systems  Constitutional: Negative for appetite change, chills, fatigue, fever and unexpected weight change.  HENT:   Negative for lump/mass, mouth sores and sore throat.   Respiratory: Negative for cough and shortness of breath.   Cardiovascular: Negative for chest pain and leg swelling.  Gastrointestinal: Negative for abdominal pain, constipation, diarrhea, nausea and vomiting.  Genitourinary: Negative for difficulty urinating, dysuria, frequency and hematuria.   Musculoskeletal: Negative for arthralgias, back pain and myalgias.  Skin: Negative for rash.  Neurological: Negative for dizziness and headaches.  Psychiatric/Behavioral: Negative for depression and sleep disturbance. The patient is not nervous/anxious.  VITALS:  There were no vitals taken for this visit.  Wt Readings from Last 3 Encounters:  07/16/20 154 lb (69.9 kg)  04/09/20 154 lb (69.9 kg)  08/23/19 155 lb 12.8 oz (70.7 kg)    There is no height or weight on file to calculate BMI.  Performance status (ECOG): 0 - Asymptomatic  PHYSICAL EXAM:  Physical Exam Vitals  and nursing note reviewed.  Constitutional:      General: She is not in acute distress.    Appearance: Normal appearance.  HENT:     Mouth/Throat:     Mouth: Mucous membranes are moist.     Pharynx: No oropharyngeal exudate or posterior oropharyngeal erythema.  Eyes:     General: No scleral icterus.    Extraocular Movements: Extraocular movements intact.     Conjunctiva/sclera: Conjunctivae normal.     Pupils: Pupils are equal, round, and reactive to light.  Cardiovascular:     Rate and Rhythm: Normal rate and regular rhythm.     Heart sounds: Normal heart sounds. No murmur heard. No friction rub. No gallop.   Pulmonary:     Effort: Pulmonary effort is normal. No respiratory distress.     Breath sounds: Normal breath sounds. No stridor. No wheezing, rhonchi or rales.  Chest:  Breasts:     Right: Normal. No swelling, bleeding, inverted nipple, mass, nipple discharge, skin change, tenderness, axillary adenopathy or supraclavicular adenopathy.     Left: No swelling, bleeding, inverted nipple, mass, nipple discharge, skin change, tenderness, axillary adenopathy or supraclavicular adenopathy.    Abdominal:     General: There is no distension.     Palpations: Abdomen is soft. There is no hepatomegaly, splenomegaly or mass.     Tenderness: There is no abdominal tenderness. There is no guarding.  Musculoskeletal:        General: No swelling. Normal range of motion.     Cervical back: Normal range of motion and neck supple. No tenderness.     Right lower leg: No edema.     Left lower leg: No edema.     Comments: Persistent cyst of the left wrist and lipoma of the left arm, which are stable.  Lymphadenopathy:     Cervical: No cervical adenopathy.     Upper Body:     Right upper body: No supraclavicular or axillary adenopathy.     Left upper body: No supraclavicular or axillary adenopathy.     Lower Body: No right inguinal adenopathy. No left inguinal adenopathy.  Skin:    General:  Skin is warm.     Coloration: Skin is not jaundiced.     Findings: Lesion (Biopsied area of the left upper back, right mid arm and scalp without evidence of infection) present. No rash.  Neurological:     Mental Status: She is alert and oriented to person, place, and time.     Cranial Nerves: No cranial nerve deficit.  Psychiatric:        Mood and Affect: Mood normal.        Behavior: Behavior normal.        Thought Content: Thought content normal.    LABS:   CBC Latest Ref Rng & Units 10/23/2017 12/26/2009  WBC 4.0 - 10.5 K/uL 4.9 -  Hemoglobin 12.0 - 15.0 g/dL 13.7 15.2(H)  Hematocrit 36.0 - 46.0 % 41.0 -  Platelets 150 - 400 K/uL 195 -   CMP Latest Ref Rng & Units 07/16/2020 05/09/2020 04/25/2020  Glucose 65 - 99  mg/dL 106(H) 105(H) 111(H)  BUN 8 - 27 mg/dL 10 16 14   Creatinine 0.57 - 1.00 mg/dL 0.63 0.79 0.78  Sodium 134 - 144 mmol/L 132(L) 137 131(L)  Potassium 3.5 - 5.2 mmol/L 4.0 4.0 4.2  Chloride 96 - 106 mmol/L 94(L) 100 94(L)  CO2 20 - 29 mmol/L 26 25 26   Calcium 8.7 - 10.3 mg/dL 9.5 9.1 9.2  Total Protein 6.0 - 8.5 g/dL - - -  Total Bilirubin 0.0 - 1.2 mg/dL - - -  Alkaline Phos 39 - 117 IU/L - - -  AST 0 - 40 IU/L - - -  ALT 0 - 32 IU/L - - -     No results found for: CEA1 / No results found for: CEA1 No results found for: PSA1 No results found for: AJO878 No results found for: MVE720  No results found for: TOTALPROTELP, ALBUMINELP, A1GS, A2GS, BETS, BETA2SER, GAMS, MSPIKE, SPEI No results found for: TIBC, FERRITIN, IRONPCTSAT No results found for: LDH  STUDIES:  No results found.    HISTORY:   Past Medical History:  Diagnosis Date  . 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 Breast cancer of upper-outer quadrant of left female breast ([place]) 08/30/2015  . Essential hypertension 09/19/2016  . 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 Hyperlipidemia 02/01/2017  . Hypokalemia 02/01/2017  . Murmur 01/27/2017  .  Palpitation 09/19/2016   Overview:  Overview:  Rapid HR, not documented  . Statin intolerance 02/01/2017    Past Surgical History:  Procedure Laterality Date  . ABDOMINAL HYSTERECTOMY    . APPENDECTOMY    . CESAREAN SECTION    . CHOLECYSTECTOMY    . MOHS SURGERY  02/2017  . THYROID SURGERY    . TONSILLECTOMY      Family History  Problem Relation Age of Onset  . Heart failure Mother   . Heart attack Mother   . Hypertension Father   . Heart attack Maternal Grandmother     Social History:  reports that she has never smoked. She has never used smokeless tobacco. She reports that she does not drink alcohol and does not use drugs.The patient is alone today.  Allergies:  Allergies  Allergen Reactions  . Ciprofloxacin Other (See Comments)    Tongue swelling and mouth tingling  . Nitrofuran Derivatives Swelling  . Sulfa Antibiotics Hives  . Oxycodone Other (See Comments)    Unknown    Current Medications: Current Outpatient Medications  Medication Sig Dispense Refill  . Calcium 600-200 MG-UNIT tablet Take 1 tablet by mouth daily.    . carvedilol (COREG) 12.5 MG tablet Take 12.5 mg by mouth in the morning and at bedtime.     [name] Kitchen esomeprazole (NEXIUM) 40 MG capsule Take 40 mg by mouth daily as needed (heartburn).  (Patient not taking: Reported on 07/16/2020)    . hydrochlorothiazide (HYDRODIURIL) 12.5 MG tablet Take 1 tablet (12.5 mg total) by mouth daily. 90 tablet 3  . telmisartan (MICARDIS) 80 MG tablet Take 80 mg by mouth daily.     No current facility-administered medications for this visit.     ASSESSMENT & PLAN:   Assessment:  1. Stage IA hormone receptor positive left breast cancer, treated with lumpectomy. She completed 5 years of hormonal therapy with anastrozole at 50% dose in December.  She remains without evidence of recurrence.   2. Left upper extremity lymphedema, mild.   3. Left forearm lipoma, which is stable. 4. Cyst of  the left wrist, which is fairly stable.    5. New basal cell carcinoma of the back and scalp  Plan:  She will have her mammogram and follow-up with Dr. Noberto Retort in 6 months. We will plan to see her back in 1 year for re-examination.  The patient understands the plans discussed today and is in agreement with them.  She knows to contact our office if she develops concerns prior to her next appointment.     Marvia Pickles, PA-C

## 2020-08-01 DIAGNOSIS — C4441 Basal cell carcinoma of skin of scalp and neck: Secondary | ICD-10-CM | POA: Diagnosis not present

## 2020-08-01 DIAGNOSIS — C44519 Basal cell carcinoma of skin of other part of trunk: Secondary | ICD-10-CM | POA: Diagnosis not present

## 2020-08-01 DIAGNOSIS — Z85828 Personal history of other malignant neoplasm of skin: Secondary | ICD-10-CM | POA: Diagnosis not present

## 2020-09-09 ENCOUNTER — Other Ambulatory Visit: Payer: Self-pay | Admitting: Vascular Surgery

## 2020-09-09 DIAGNOSIS — Z1231 Encounter for screening mammogram for malignant neoplasm of breast: Secondary | ICD-10-CM

## 2020-11-28 DIAGNOSIS — K644 Residual hemorrhoidal skin tags: Secondary | ICD-10-CM | POA: Diagnosis not present

## 2020-11-28 DIAGNOSIS — K5730 Diverticulosis of large intestine without perforation or abscess without bleeding: Secondary | ICD-10-CM | POA: Diagnosis not present

## 2020-12-31 DIAGNOSIS — K5730 Diverticulosis of large intestine without perforation or abscess without bleeding: Secondary | ICD-10-CM | POA: Diagnosis not present

## 2020-12-31 DIAGNOSIS — D124 Benign neoplasm of descending colon: Secondary | ICD-10-CM | POA: Diagnosis not present

## 2020-12-31 DIAGNOSIS — Z86010 Personal history of colonic polyps: Secondary | ICD-10-CM | POA: Diagnosis not present

## 2020-12-31 DIAGNOSIS — Z1211 Encounter for screening for malignant neoplasm of colon: Secondary | ICD-10-CM | POA: Diagnosis not present

## 2020-12-31 DIAGNOSIS — D126 Benign neoplasm of colon, unspecified: Secondary | ICD-10-CM | POA: Diagnosis not present

## 2020-12-31 DIAGNOSIS — K644 Residual hemorrhoidal skin tags: Secondary | ICD-10-CM | POA: Diagnosis not present

## 2021-01-12 NOTE — Progress Notes (Signed)
Cardiology Office Note:    Date:  01/13/2021   ID:  Natalie Mack, DOB 1945-11-05, MRN [id number]  PCP:  Ronita Hipps, MD  Cardiologist:  Shirlee More, MD    Referring MD: Ronita Hipps, MD    ASSESSMENT:    1. Essential hypertension   2. Pure hypercholesterolemia    PLAN:    In order of problems listed above:  Hypertension is well controlled she has good technique health management continue to monitor home and continue her current regimen including thiazide diuretic check renal function potassium ARB beta-blocker and if she is getting systolics consistently greater than 140 will contact me and may require an additional agent. Continues to decline lipid-lowering therapy I encouraged her to get a [facility] screening if she has demonstrable atherosclerosis it could possibly change her opinion.   Next appointment: 1 year   Medication Adjustments/Labs and Tests Ordered: Current medicines are reviewed at length with the patient today.  Concerns regarding medicines are outlined above.  Orders Placed This Encounter  Procedures   Comprehensive metabolic panel   Lipid panel   EKG 12-Lead    No orders of the defined types were placed in this encounter.   No chief complaint on file.   History of Present Illness:    Natalie Mack is a 76 y.o. female with a hx of hypertension hyperlipidemia and increased cardiovascular risk with a history of breast cancer treated with surgery radiation and hormonal estrogen blocker therapy.  She has had hypokalemia in the past with thiazide diuretic and symptomatic hypotension with spironolactone.  Her lipids have been severely elevated and she is declined lipid-lowering treatment with a statin.  She was last seen 07/16/2020.  Compliance with diet, lifestyle and medications: Yes  She has good healthcare habits.  She blood pressure at home consistently runs 120-140/70.  I rechecked her blood pressure in the office and moderate 148/70.  She  tolerates her antihypertensives without side effects we will go ahead and check her renal function and recheck her lipid profile although at this time she refuses lipid-lowering treatment.  I encouraged her to do a [facility] screening.  No angina palpitation or syncope. 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   Breast cancer of upper-outer quadrant of left female breast ([place]) 08/30/2015   Essential hypertension 09/19/2016   Estrogen receptor positive neoplasm 08/30/2015   Hyperlipidemia 02/01/2017   Hypokalemia 02/01/2017   Murmur 01/27/2017   Palpitation 09/19/2016   Overview:  Overview:  Rapid HR, not documented   Statin intolerance 02/01/2017    Past Surgical History:  Procedure Laterality Date   ABDOMINAL HYSTERECTOMY     APPENDECTOMY     CESAREAN SECTION     CHOLECYSTECTOMY     MOHS SURGERY  02/2017   THYROID SURGERY     TONSILLECTOMY      Current Medications: Current Meds  Medication Sig   Calcium 600-200 MG-UNIT tablet Take 1 tablet by mouth daily.   carvedilol (COREG) 12.5 MG tablet Take 12.5 mg by mouth in the morning and at bedtime.    hydrochlorothiazide (HYDRODIURIL) 12.5 MG tablet Take 1 tablet (12.5 mg total) by mouth daily. (Patient taking differently: Take 12.5 mg by mouth daily. Takes 0.5 tablet ( 6.25 mg ) daily)   telmisartan (MICARDIS) 80 MG tablet Take 80 mg by mouth daily.     Allergies:   Ciprofloxacin, Nitrofuran derivatives, Sulfa antibiotics, and Oxycodone  Social History   Socioeconomic History   Marital status: Married    Spouse name: Not on file   Number of children: Not on file   Years of education: Not on file   Highest education level: Not on file  Occupational History   Not on file  Tobacco Use   Smoking status: Never   Smokeless tobacco: Never  Vaping Use   Vaping Use: Never used  Substance and Sexual Activity   Alcohol use: No   Drug use: No   Sexual  activity: Not on file  Other Topics Concern   Not on file  Social History Narrative   Not on file   Social Determinants of Health   Financial Resource Strain: Not on file  Food Insecurity: Not on file  Transportation Needs: Not on file  Physical Activity: Not on file  Stress: Not on file  Social Connections: Not on file     Family History: The patient's family history includes Heart attack in her maternal grandmother and mother; Heart failure in her mother; Hypertension in her father. ROS:   Please see the history of present illness.    All other systems reviewed and are negative.  EKGs/Labs/Other Studies Reviewed:    The following studies were reviewed today:  EKG:  EKG ordered today and personally reviewed.  The ekg ordered today demonstrates sinus rhythm first-degree AV block possible anteroseptal MI otherwise normal unchanged from previous tracings  Recent Labs: 07/16/2020: BUN 10; Creatinine, Ser 0.63; Potassium 4.0; Sodium 132  Recent Lipid Panel    Component Value Date/Time   CHOL 240 (H) 08/23/2019 1427   TRIG 74 08/23/2019 1427   HDL 60 08/23/2019 1427   CHOLHDL 4.0 08/23/2019 1427   LDLCALC 167 (H) 08/23/2019 1427    Physical Exam:    VS:  BP (!) 148/70   Pulse 78   Ht 5\' 7"  (1.702 m)   Wt 149 lb (67.6 kg)   SpO2 97%   BMI 23.34 kg/m     Wt Readings from Last 3 Encounters:  01/13/21 149 lb (67.6 kg)  07/23/20 154 lb (69.9 kg)  07/16/20 154 lb (69.9 kg)     GEN:  Well nourished, well developed in no acute distress HEENT: Normal NECK: No JVD; No carotid bruits LYMPHATICS: No lymphadenopathy CARDIAC: RRR, no murmurs, rubs, gallops RESPIRATORY:  Clear to auscultation without rales, wheezing or rhonchi  ABDOMEN: Soft, non-tender, non-distended MUSCULOSKELETAL:  No edema; No deformity  SKIN: Warm and dry NEUROLOGIC:  Alert and oriented x 3 PSYCHIATRIC:  Normal affect    Signed, Shirlee More, MD  01/13/2021 1:29 PM    [facility]

## 2021-01-13 ENCOUNTER — Ambulatory Visit: Payer: Medicare PPO | Admitting: Cardiology

## 2021-01-13 ENCOUNTER — Other Ambulatory Visit: Payer: Self-pay

## 2021-01-13 VITALS — BP 148/70 | HR 78 | Ht 67.0 in | Wt 149.0 lb

## 2021-01-13 DIAGNOSIS — I10 Essential (primary) hypertension: Secondary | ICD-10-CM | POA: Diagnosis not present

## 2021-01-13 DIAGNOSIS — E7800 Pure hypercholesterolemia, unspecified: Secondary | ICD-10-CM | POA: Diagnosis not present

## 2021-01-13 NOTE — Patient Instructions (Signed)

## 2021-01-14 ENCOUNTER — Telehealth: Payer: Self-pay

## 2021-01-14 LAB — COMPREHENSIVE METABOLIC PANEL
ALT: 9 IU/L (ref 0–32)
AST: 15 IU/L (ref 0–40)
Albumin/Globulin Ratio: 2.3 — ABNORMAL HIGH (ref 1.2–2.2)
Albumin: 4.4 g/dL (ref 3.7–4.7)
Alkaline Phosphatase: 89 IU/L (ref 44–121)
BUN/Creatinine Ratio: 12 (ref 12–28)
BUN: 10 mg/dL (ref 8–27)
Bilirubin Total: 0.6 mg/dL (ref 0.0–1.2)
CO2: 25 mmol/L (ref 20–29)
Calcium: 9.3 mg/dL (ref 8.7–10.3)
Chloride: 101 mmol/L (ref 96–106)
Creatinine, Ser: 0.81 mg/dL (ref 0.57–1.00)
Globulin, Total: 1.9 g/dL (ref 1.5–4.5)
Glucose: 95 mg/dL (ref 65–99)
Potassium: 3.9 mmol/L (ref 3.5–5.2)
Sodium: 140 mmol/L (ref 134–144)
Total Protein: 6.3 g/dL (ref 6.0–8.5)
eGFR: 75 mL/min/{1.73_m2} (ref >59)

## 2021-01-14 LAB — LIPID PANEL
Chol/HDL Ratio: 4.6 ratio — ABNORMAL HIGH (ref 0.0–4.4)
Cholesterol, Total: 242 mg/dL — ABNORMAL HIGH (ref 100–199)
HDL: 53 mg/dL (ref >39)
LDL Chol Calc (NIH): 159 mg/dL — ABNORMAL HIGH (ref 0–99)
Triglycerides: 166 mg/dL — ABNORMAL HIGH (ref 0–149)
VLDL Cholesterol Cal: 30 mg/dL (ref 5–40)

## 2021-01-14 NOTE — Telephone Encounter (Signed)
-----   Message from Richardo Priest, MD sent at 01/14/2021  7:46 AM EDT ----- Normal or stable result  She has declined lipid-lowering treatment

## 2021-01-14 NOTE — Telephone Encounter (Signed)
Spoke with patient regarding results and recommendation.  Patient verbalizes understanding and is agreeable to plan of care. Advised patient to call back with any issues or concerns.  

## 2021-01-20 ENCOUNTER — Other Ambulatory Visit: Payer: Self-pay

## 2021-01-20 ENCOUNTER — Ambulatory Visit
Admission: RE | Admit: 2021-01-20 | Discharge: 2021-01-20 | Disposition: A | Discharge status: Home / Self Care | Payer: Medicare PPO | Source: Ambulatory Visit | Attending: Vascular Surgery | Admitting: Vascular Surgery

## 2021-01-20 DIAGNOSIS — Z1231 Encounter for screening mammogram for malignant neoplasm of breast: Secondary | ICD-10-CM | POA: Diagnosis not present

## 2021-01-28 DIAGNOSIS — C50412 Malignant neoplasm of upper-outer quadrant of left female breast: Secondary | ICD-10-CM | POA: Diagnosis not present

## 2021-01-28 DIAGNOSIS — Z170 Estrogen receptor positive status [ER+]: Secondary | ICD-10-CM | POA: Diagnosis not present

## 2021-01-28 DIAGNOSIS — D1722 Benign lipomatous neoplasm of skin and subcutaneous tissue of left arm: Secondary | ICD-10-CM | POA: Diagnosis not present

## 2021-02-06 DIAGNOSIS — K5900 Constipation, unspecified: Secondary | ICD-10-CM | POA: Diagnosis not present

## 2021-02-06 DIAGNOSIS — R1013 Epigastric pain: Secondary | ICD-10-CM | POA: Diagnosis not present

## 2021-02-06 DIAGNOSIS — K649 Unspecified hemorrhoids: Secondary | ICD-10-CM | POA: Diagnosis not present

## 2021-02-13 ENCOUNTER — Telehealth: Payer: Self-pay

## 2021-02-13 NOTE — Telephone Encounter (Addendum)
I spoke with pt, appt given for tomorrow w/Kelli,PA.  ----- Message from Marvia Pickles, PA-C sent at 02/12/2021  5:33 PM EDT ----- Regarding: RE: Pt has swelling in left axilla and breast area She probably just needs to see the lymphedema specialist, but if she is ok seeing me, you can add with me Friday or Monday. Thanks! ----- Message ----- From: Dairl Ponder, RN Sent: 02/12/2021   4:48 PM EDT To: Marvia Pickles, PA-C Subject: Pt has swelling in left axilla and breast ar#  Pt called to request an appt for someone to check her left axilla and breast (what she has left she stated). She reports achiness from axilla to elbow. She has had intermittent swelling before, but this is worse. No skin lesions on chest wall. Nothing feels firm.

## 2021-02-14 ENCOUNTER — Encounter: Payer: Self-pay | Admitting: Hematology and Oncology

## 2021-02-14 ENCOUNTER — Inpatient Hospital Stay: Payer: Medicare PPO | Attending: Hematology and Oncology | Admitting: Hematology and Oncology

## 2021-02-14 ENCOUNTER — Other Ambulatory Visit: Payer: Self-pay

## 2021-02-14 VITALS — BP 141/91 | Temp 97.8°F | Resp 20 | Ht 67.0 in | Wt 149.0 lb

## 2021-02-14 DIAGNOSIS — C50412 Malignant neoplasm of upper-outer quadrant of left female breast: Secondary | ICD-10-CM | POA: Diagnosis not present

## 2021-02-14 DIAGNOSIS — Z170 Estrogen receptor positive status [ER+]: Secondary | ICD-10-CM

## 2021-02-14 NOTE — Progress Notes (Signed)
[name]  [address] [place],  [place]  [address] [phone]  Clinic Day:  02/14/2021  Referring physician: Ronita Hipps, MD   CHIEF COMPLAINT:  CC:  Swelling of the left breast and axilla  Current Treatment:  Observation   HISTORY OF PRESENT ILLNESS:  Natalie Mack is a 75 y.o. 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  She was treated with lumpectomy.  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  2 sentinel nodes were negative for metastasis.  A medial margin was positive, so she underwent re-excision, which did not reveal any residual cancer.  Estrogen and progesterone receptors were positive and HER 2/Neu negative.  Ki 67 was 5%.  We discussed the option of Oncotype DX, but she felt she would not be interested in chemotherapy regardless, so this was not pursued.  She received adjuvant radiation to the left breast completed in November 2016.  Bone density scan in July 2016 was normal.  She was placed on anastrozole 1 mg daily in December 2016.  She had difficulty tolerating anastrozole 1 mg daily due to arthralgias, vaginal pressure and non refreshing sleep.  She discontinued anastrozole after about 2 months, then later started taking half a pill daily, which she has tolerated fairly well.  Repeat bone density scan in August 2018 remained normal, with just slight worsening since 2008.  The patient continues on calcium and vitamin-D.  Bilateral diagnostic mammogram in August 2020 did not reveal any evidence of malignancy. She completed 5 years of anastrozole at 50% dose in December.  She had an enlarging lipoma that is 3-4 cm in diameter in the left forearm, but on the palmar surface of her wrist, she had a cyst measuring 2 cm long, which has enlarged. We recommended  excision when we saw her in July 2021. Bilateral diagnostic mammogram in August 2021 did not reveal any evidence of malignancy. Screening mammogram in 1 year was recommended.    At her visit in February, she had recently had 2 new basal cell carcinomas of the skin of her back and scalp and was to undergo excision of these.  She had not had the cyst of the left wrist excised, which seemed to be growing slowly.  She stated Dr. Noberto Retort is monitoring this as well. She has routine labs drawn at her primary care office.  INTERVAL HISTORY:  Natalie Mack is added to the schedule today as she telephoned reporting pain and swelling in the left axilla and breast. She states she has had previous swelling under the arm, but not in the breast. This seems worse since her mammogram.  She has been using Tylenol as needed. She denies swelling of the left arm.  She denies fevers or chills. She  denies pain. Her appetite is good. Her weight has been stable. She had a bilateral screening mammogram in August, which did not reveal any evidence of malignancy.  She saw Dr. Noberto Retort in August as well.  She has had the skin cancers removed.  REVIEW OF SYSTEMS:  Review of Systems  Constitutional:  Negative for appetite change, chills, fatigue, fever and unexpected weight change.  HENT:   Negative for lump/mass, mouth sores and sore throat.   Respiratory:  Negative for cough and shortness of breath.   Cardiovascular:  Negative for chest pain and  leg swelling.  Gastrointestinal:  Negative for abdominal pain, constipation, diarrhea, nausea and vomiting.  Genitourinary:  Negative for difficulty urinating, dysuria, frequency and hematuria.   Musculoskeletal:  Negative for arthralgias, back pain and myalgias.  Skin:  Negative for rash.  Neurological:  Negative for dizziness and headaches.  Psychiatric/Behavioral:  Negative for depression and sleep disturbance. The patient is not nervous/anxious.     VITALS:  Blood pressure (!) 141/91,  temperature 97.8 F (36.6 C), temperature source Oral, resp. rate 20, height 5\' 7"  (1.702 m), weight 149 lb (67.6 kg), SpO2 94 %.  Wt Readings from Last 3 Encounters:  02/14/21 149 lb (67.6 kg)  01/13/21 149 lb (67.6 kg)  07/23/20 154 lb (69.9 kg)    Body mass index is 23.34 kg/m.  Performance status (ECOG): 0 - Asymptomatic  PHYSICAL EXAM:  Physical Exam Vitals and nursing note reviewed.  Constitutional:      General: She is not in acute distress.    Appearance: Normal appearance.  HENT:     Mouth/Throat:     Mouth: Mucous membranes are moist.     Pharynx: No oropharyngeal exudate or posterior oropharyngeal erythema.  Eyes:     General: No scleral icterus.    Extraocular Movements: Extraocular movements intact.     Conjunctiva/sclera: Conjunctivae normal.     Pupils: Pupils are equal, round, and reactive to light.  Cardiovascular:     Rate and Rhythm: Normal rate and regular rhythm.     Heart sounds: Normal heart sounds. No murmur heard.   No friction rub. No gallop.  Pulmonary:     Effort: Pulmonary effort is normal. No respiratory distress.     Breath sounds: Normal breath sounds. No stridor. No wheezing, rhonchi or rales.  Chest:  Breasts:    Right: Normal. No swelling, bleeding, inverted nipple, mass, nipple discharge, skin change or tenderness.     Left: Swelling present. No bleeding, inverted nipple, mass, nipple discharge, skin change or tenderness.     Comments:  There is mild swelling of the left upper breast and chest wall, as well as left axilla without any palpable masses or adenopathy. Abdominal:     General: There is no distension.     Palpations: Abdomen is soft. There is no hepatomegaly, splenomegaly or mass.     Tenderness: There is no abdominal tenderness. There is no guarding.  Musculoskeletal:        General: No swelling. Normal range of motion.     Cervical back: Normal range of motion and neck supple. No tenderness.     Right lower leg: No edema.      Left lower leg: No edema.     Comments: Persistent cyst of the left wrist and lipoma of the left arm, which are stable.  Lymphadenopathy:     Cervical: No cervical adenopathy.     Upper Body:     Right upper body: No supraclavicular or axillary adenopathy.     Left upper body: No supraclavicular or axillary adenopathy.     Lower Body: No right inguinal adenopathy. No left inguinal adenopathy.  Skin:    General: Skin is warm.     Coloration: Skin is not jaundiced.     Findings: No rash.  Neurological:     Mental Status: She is alert and oriented to person, place, and time.     Cranial Nerves: No cranial nerve deficit.  Psychiatric:        Mood and Affect: Mood normal.  Behavior: Behavior normal.        Thought Content: Thought content normal.   LABS:   CBC Latest Ref Rng & Units 10/23/2017 12/26/2009  WBC 4.0 - 10.5 K/uL 4.9 -  Hemoglobin 12.0 - 15.0 g/dL 13.7 15.2(H)  Hematocrit 36.0 - 46.0 % 41.0 -  Platelets 150 - 400 K/uL 195 -   CMP Latest Ref Rng & Units 01/13/2021 07/16/2020 05/09/2020  Glucose 65 - 99 mg/dL 95 106(H) 105(H)  BUN 8 - 27 mg/dL 10 10 16   Creatinine 0.57 - 1.00 mg/dL 0.81 0.63 0.79  Sodium 134 - 144 mmol/L 140 132(L) 137  Potassium 3.5 - 5.2 mmol/L 3.9 4.0 4.0  Chloride 96 - 106 mmol/L 101 94(L) 100  CO2 20 - 29 mmol/L 25 26 25   Calcium 8.7 - 10.3 mg/dL 9.3 9.5 9.1  Total Protein 6.0 - 8.5 g/dL 6.3 - -  Total Bilirubin 0.0 - 1.2 mg/dL 0.6 - -  Alkaline Phos 44 - 121 IU/L 89 - -  AST 0 - 40 IU/L 15 - -  ALT 0 - 32 IU/L 9 - -     No results found for: CEA1 / No results found for: CEA1 No results found for: PSA1 No results found for: WJX914 No results found for: NWG956  No results found for: TOTALPROTELP, ALBUMINELP, A1GS, A2GS, BETS, BETA2SER, GAMS, MSPIKE, SPEI No results found for: TIBC, FERRITIN, IRONPCTSAT No results found for: LDH  STUDIES:  MM 3D SCREEN BREAST BILATERAL  Result Date: 01/21/2021 CLINICAL DATA:  Screening. History of  left lumpectomy 2016. EXAM: DIGITAL SCREENING BILATERAL MAMMOGRAM WITH TOMOSYNTHESIS AND CAD TECHNIQUE: Bilateral screening digital craniocaudal and mediolateral oblique mammograms were obtained. Bilateral screening digital breast tomosynthesis was performed. The images were evaluated with computer-aided detection. COMPARISON:  Previous exam(s). ACR Breast Density Category c: The breast tissue is heterogeneously dense, which may obscure small masses. FINDINGS: There are no findings suspicious for malignancy. IMPRESSION: No mammographic evidence of malignancy. A result letter of this screening mammogram will be mailed directly to the patient. RECOMMENDATION: Screening mammogram in one year. (Code:SM-B-01Y) BI-RADS CATEGORY  1: Negative. Electronically Signed   By: Everlean Alstrom M.D.   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   Breast cancer of upper-outer quadrant of left female breast ([place]) 08/30/2015   Essential hypertension 09/19/2016   Estrogen receptor positive neoplasm 08/30/2015   Hyperlipidemia 02/01/2017   Hypokalemia 02/01/2017   Murmur 01/27/2017   Palpitation 09/19/2016   Overview:  Overview:  Rapid HR, not documented   Statin intolerance 02/01/2017    Past Surgical History:  Procedure Laterality Date   ABDOMINAL HYSTERECTOMY     APPENDECTOMY     CESAREAN SECTION     CHOLECYSTECTOMY     MOHS SURGERY  02/2017   THYROID SURGERY     TONSILLECTOMY      Family History  Problem Relation Age of Onset   Heart failure Mother    Heart attack Mother    Hypertension Father    Heart attack Maternal Grandmother     Social History:  reports that she has never smoked. She has never used smokeless tobacco. She reports that she does not drink alcohol and does not use drugs.The patient is alone today.  Allergies:  Allergies  Allergen Reactions   Ciprofloxacin Other (See  Comments)    Tongue swelling and  mouth tingling   Nitrofuran Derivatives Swelling   Sulfa Antibiotics Hives   Oxycodone Other (See Comments)    Unknown    Current Medications: Current Outpatient Medications  Medication Sig Dispense Refill   Calcium 600-200 MG-UNIT tablet Take 1 tablet by mouth daily.     carvedilol (COREG) 12.5 MG tablet Take 12.5 mg by mouth in the morning and at bedtime.      hydrochlorothiazide (HYDRODIURIL) 12.5 MG tablet Take 1 tablet (12.5 mg total) by mouth daily. (Patient taking differently: Take 12.5 mg by mouth daily. Takes 0.5 tablet ( 6.25 mg ) daily) 90 tablet 3   telmisartan (MICARDIS) 80 MG tablet Take 80 mg by mouth daily.     No current facility-administered medications for this visit.     ASSESSMENT & PLAN:   Assessment:  1. Stage IA hormone receptor positive left breast cancer, treated with lumpectomy. She completed 5 years of hormonal therapy with anastrozole at 50% dose in December.  She remains without evidence of recurrence.   2. Left upper extremity lymphedema, resolved.   3. Left forearm lipoma, which is stable. 4. Cyst of the left wrist, which is fairly stable.   5. New basal cell carcinoma of the back and scalp, status post complete excision 6. Swelling of the left breast axilla, likely due to lymphedema.  The patient knows to contact us if this worsens and we will obtain an ultrasound for further evaluation.  Plan:  We will plan to see her back in February for re-examination as previously scheduled.  The patient understands the plans discussed today and is in agreement with them.  She knows to contact our office if she develops concerns prior to her next appointment.     Natalie Pickles, PA-C

## 2021-02-26 ENCOUNTER — Telehealth: Payer: Self-pay | Admitting: Cardiology

## 2021-02-26 NOTE — Telephone Encounter (Signed)
New Message:      Patient said she just found out her insurance will not pay for her Telmisartan. She said she will need another medicine to replace this please.

## 2021-02-27 MED ORDER — VALSARTAN 80 MG PO TABS: 80.0000 mg | ORAL_TABLET | Freq: Every day | ORAL | 3 refills | Status: DC

## 2021-02-27 NOTE — Telephone Encounter (Signed)
Spoke to the patient just now and let her know Dr. Terrial Rhodes recommendations. She verbalizes understanding and is agreeable to this switch.    Encouraged patient to call back with any questions or concerns.

## 2021-03-12 DIAGNOSIS — E1165 Type 2 diabetes mellitus with hyperglycemia: Secondary | ICD-10-CM | POA: Diagnosis not present

## 2021-03-12 DIAGNOSIS — I10 Essential (primary) hypertension: Secondary | ICD-10-CM | POA: Diagnosis not present

## 2021-03-12 DIAGNOSIS — Z853 Personal history of malignant neoplasm of breast: Secondary | ICD-10-CM | POA: Diagnosis not present

## 2021-03-12 DIAGNOSIS — Z0000 Encounter for general adult medical examination without abnormal findings: Secondary | ICD-10-CM | POA: Diagnosis not present

## 2021-03-12 DIAGNOSIS — Z6823 Body mass index (BMI) 23.0-23.9, adult: Secondary | ICD-10-CM | POA: Diagnosis not present

## 2021-03-12 DIAGNOSIS — Z1331 Encounter for screening for depression: Secondary | ICD-10-CM | POA: Diagnosis not present

## 2021-04-01 DIAGNOSIS — D1722 Benign lipomatous neoplasm of skin and subcutaneous tissue of left arm: Secondary | ICD-10-CM | POA: Diagnosis not present

## 2021-04-02 ENCOUNTER — Telehealth: Payer: Self-pay | Admitting: Cardiology

## 2021-04-02 DIAGNOSIS — D1722 Benign lipomatous neoplasm of skin and subcutaneous tissue of left arm: Secondary | ICD-10-CM | POA: Diagnosis not present

## 2021-04-02 DIAGNOSIS — R002 Palpitations: Secondary | ICD-10-CM

## 2021-04-02 DIAGNOSIS — I10 Essential (primary) hypertension: Secondary | ICD-10-CM

## 2021-04-02 MED ORDER — TELMISARTAN 80 MG PO TABS: 80.0000 mg | ORAL_TABLET | Freq: Every day | ORAL | 6 refills | Status: DC

## 2021-04-02 NOTE — Telephone Encounter (Signed)
Pt c/o medication issue:  1. Name of Medication: telmisartan (MICARDIS) 80 MG tablet  2. How are you currently taking this medication (dosage and times per day)? 80 mg daily  3. Are you having a reaction (difficulty breathing--STAT)?   4. What is your medication issue? Patient states she started taking this medication on Monday. She was on valsartan (DIOVAN) 80 MG tablet but it made her heart race. She also got Covid the first weekend of October but has not been right since. She wasn't not sure if the COVID made her heart race or if it was the medication.   She wanted to make an appointment to see Dr. Bettina Gavia to talk to him and find out what to do

## 2021-04-02 NOTE — Telephone Encounter (Signed)
Spoke with pt who states that she has been having a rapid heart rate since the first of October. Pt states that she has not felt right and wasn't sure if it was COVID or the Valsartan. Pt states she tried it for a month but did not like how she felt. Pt states her BP was 112/92 HR 90's and 110/63 HR 90's. Pt states that she had some of her telmisartan left and she started it back on Monday. Pt states since restarting her heart rate is in the 60's and she is feeling better. Pt states that her BP was 140/80 in a doctor's office yesterday but it is normally higher than that and was having a procedure done. Pt also states at her PCP appointment her labs showed her potassium at 3.2 and she was started on potassium.How do you advise?

## 2021-04-02 NOTE — Telephone Encounter (Signed)
Recommendations reviewed with Natalie Mack as per Dr. Joya Gaskins note.  Natalie Mack verbalized understanding and had no additional questions. Natalie Mack will come 04/03/21 at 9:30 for 7 day Zio to be placed.

## 2021-04-02 NOTE — Addendum Note (Signed)
Addended by: Truddie Hidden on: 04/02/2021 01:22 PM   Modules accepted: Orders

## 2021-04-03 ENCOUNTER — Other Ambulatory Visit (INDEPENDENT_AMBULATORY_CARE_PROVIDER_SITE_OTHER): Payer: Medicare PPO

## 2021-04-03 ENCOUNTER — Other Ambulatory Visit: Payer: Self-pay

## 2021-04-03 DIAGNOSIS — R002 Palpitations: Secondary | ICD-10-CM

## 2021-04-14 DIAGNOSIS — R002 Palpitations: Secondary | ICD-10-CM | POA: Diagnosis not present

## 2021-04-23 ENCOUNTER — Telehealth: Payer: Self-pay | Admitting: Cardiology

## 2021-04-23 NOTE — Telephone Encounter (Signed)
Pt advised that Dr. Bettina Gavia has not completed the review. Once completed we will call with the results. Please forward to Dr. Bettina Gavia on his return.

## 2021-04-23 NOTE — Telephone Encounter (Signed)
Patient states she sent her heart monitor back about 2 weeks ago and she would like to know if it has been received yet. Please advise.

## 2021-04-24 NOTE — Telephone Encounter (Signed)
Spoke to patient just now and let her know that per Dr. Bettina Gavia the monitor looked good and he would not recommend any changes at this time. She verbalizes understanding.    Encouraged patient to call back with any questions or concerns.

## 2021-07-07 DIAGNOSIS — R300 Dysuria: Secondary | ICD-10-CM | POA: Diagnosis not present

## 2021-07-07 DIAGNOSIS — R252 Cramp and spasm: Secondary | ICD-10-CM | POA: Diagnosis not present

## 2021-07-07 DIAGNOSIS — E1165 Type 2 diabetes mellitus with hyperglycemia: Secondary | ICD-10-CM | POA: Diagnosis not present

## 2021-07-07 DIAGNOSIS — Z6823 Body mass index (BMI) 23.0-23.9, adult: Secondary | ICD-10-CM | POA: Diagnosis not present

## 2021-07-14 DIAGNOSIS — C4441 Basal cell carcinoma of skin of scalp and neck: Secondary | ICD-10-CM | POA: Diagnosis not present

## 2021-07-14 DIAGNOSIS — D2261 Melanocytic nevi of right upper limb, including shoulder: Secondary | ICD-10-CM | POA: Diagnosis not present

## 2021-07-14 DIAGNOSIS — L820 Inflamed seborrheic keratosis: Secondary | ICD-10-CM | POA: Diagnosis not present

## 2021-07-14 DIAGNOSIS — Z85828 Personal history of other malignant neoplasm of skin: Secondary | ICD-10-CM | POA: Diagnosis not present

## 2021-07-14 DIAGNOSIS — L821 Other seborrheic keratosis: Secondary | ICD-10-CM | POA: Diagnosis not present

## 2021-07-14 DIAGNOSIS — D2271 Melanocytic nevi of right lower limb, including hip: Secondary | ICD-10-CM | POA: Diagnosis not present

## 2021-07-14 DIAGNOSIS — D485 Neoplasm of uncertain behavior of skin: Secondary | ICD-10-CM | POA: Diagnosis not present

## 2021-07-14 DIAGNOSIS — D1801 Hemangioma of skin and subcutaneous tissue: Secondary | ICD-10-CM | POA: Diagnosis not present

## 2021-07-17 NOTE — Progress Notes (Signed)
[facility]  [address] [place],  [place]  [address] [phone]  Clinic Day:  07/23/2021  Referring physician: Ronita Hipps, MD  This document serves as a record of services personally performed by Hosie Poisson, MD. It was created on their behalf by Curry,Lauren E, a trained medical scribe. The creation of this record is based on the scribe's personal observations and the provider's statements to them.  CHIEF COMPLAINT:  CC:  History of stage IA hormone receptor positive breast cancer  Current Treatment:  Observation   HISTORY OF PRESENT ILLNESS:  Natalie Mack is a 76 y.o. 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  She was treated with lumpectomy.  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  2 sentinel nodes were negative for metastasis.  A medial margin was positive, so she underwent re-excision, which did not reveal any residual cancer.  Estrogen and progesterone receptors were positive and HER 2/Neu negative.  Ki 67 was 5%.  We discussed the option of Oncotype DX, but she felt she would not be interested in chemotherapy regardless, so this was not pursued.  She received adjuvant radiation to the left breast completed in November 2016.  Bone density scan in July 2016 was normal.  She was placed on anastrozole 1 mg daily in December 2016.  She had difficulty tolerating anastrozole 1 mg daily due to arthralgias, vaginal pressure and non refreshing sleep.  She discontinued anastrozole after about 2 months, then later started taking half a pill daily, which she has tolerated fairly well.  Repeat bone density scan in August 2018 remained normal, with just slight worsening since 2008.  The patient continues on calcium and vitamin-D.  Bilateral diagnostic mammogram in  August 2020 did not reveal any evidence of malignancy. She completed 5 years of anastrozole at 50% dose in December 2021.  She had an enlarging lipoma that is 3-4 cm in diameter in the left forearm, but on the palmar surface of her wrist, she had a cyst measuring 2 cm long, which had enlarged. We recommended excision when we saw her in July 2021. At her visit in February 2022, she had recently had 2 new basal cell carcinomas of the skin of her back and scalp and was to undergo excision of these.  She had not had the cyst of the left wrist excised, which seemed to be growing slowly.  She stated Dr. Noberto Retort is monitoring this as well. She has routine labs drawn at her primary care office. She was seen for an extra appointment in September 2022 for some swelling of the arm which was felt to be mild lymphedema.  INTERVAL HISTORY:  Natalie Mack is here for routine follow up and states that she has been well other than neck pain for the past 2 weeks. She states that her range of motion of the neck is decreased, and she rates her pain as a 2/10 today. Annual mammogram from August 2022 was clear and has followed up with Dr. Noberto Retort. She undergoes routine lab work with Dr. Helene Kelp. Her  appetite is good, and she has gained 2 pounds since her last visit.  She denies fever, chills or other signs of infection.  She denies nausea, vomiting, bowel issues, or abdominal pain.  She denies sore throat, cough, dyspnea, or chest pain.  REVIEW  OF SYSTEMS:  Review of Systems  Constitutional: Negative.  Negative for appetite change, chills, fatigue, fever and unexpected weight change.  HENT:  Negative.    Eyes: Negative.   Respiratory: Negative.  Negative for chest tightness, cough, hemoptysis, shortness of breath and wheezing.   Cardiovascular: Negative.  Negative for chest pain, leg swelling and palpitations.  Gastrointestinal: Negative.  Negative for abdominal distention, abdominal pain, blood in stool, constipation, diarrhea,  nausea and vomiting.  Endocrine: Negative.   Genitourinary: Negative.  Negative for difficulty urinating, dysuria, frequency and hematuria.   Musculoskeletal:  Positive for neck pain and neck stiffness. Negative for arthralgias, back pain, flank pain, gait problem and myalgias.  Skin: Negative.   Neurological: Negative.  Negative for dizziness, extremity weakness, gait problem, headaches, light-headedness, numbness, seizures and speech difficulty.  Hematological: Negative.   Psychiatric/Behavioral: Negative.  Negative for depression and sleep disturbance. The patient is not nervous/anxious.     VITALS:  Blood pressure (!) 220/112, pulse 73, temperature 97.7 F (36.5 C), temperature source Oral, resp. rate 16, weight 151 lb (68.5 kg), SpO2 98 %.  Wt Readings from Last 3 Encounters:  07/23/21 151 lb (68.5 kg)  02/14/21 149 lb (67.6 kg)  01/13/21 149 lb (67.6 kg)    Body mass index is 23.65 kg/m.  Performance status (ECOG): 1 - Symptomatic but completely ambulatory  PHYSICAL EXAM:  Physical Exam Constitutional:      General: She is not in acute distress.    Appearance: Normal appearance. She is normal weight.  HENT:     Head: Normocephalic and atraumatic.  Eyes:     General: No scleral icterus.    Extraocular Movements: Extraocular movements intact.     Conjunctiva/sclera: Conjunctivae normal.     Pupils: Pupils are equal, round, and reactive to light.  Cardiovascular:     Rate and Rhythm: Normal rate and regular rhythm.     Pulses: Normal pulses.     Heart sounds: Normal heart sounds. No murmur heard.   No friction rub. No gallop.  Pulmonary:     Effort: Pulmonary effort is normal. No respiratory distress.     Breath sounds: Normal breath sounds.  Chest:     Comments: Deep scar of the left nipple which is well healed. Well healed left axillary incision. Lower half of the right breast has scattered fibrocystic changes. No masses in either breast. Abdominal:     General:  Bowel sounds are normal. There is no distension.     Palpations: Abdomen is soft. There is no hepatomegaly, splenomegaly or mass.     Tenderness: There is no abdominal tenderness.  Musculoskeletal:        General: Normal range of motion.     Cervical back: Normal range of motion and neck supple.     Right lower leg: No edema.     Left lower leg: No edema.  Lymphadenopathy:     Cervical: No cervical adenopathy.  Skin:    General: Skin is warm and dry.  Neurological:     General: No focal deficit present.     Mental Status: She is alert and oriented to person, place, and time. Mental status is at baseline.  Psychiatric:        Mood and Affect: Mood normal.        Behavior: Behavior normal.        Thought Content: Thought content normal.        Judgment: Judgment normal.   LABS:   CBC Latest  Ref Rng & Units 10/23/2017 12/26/2009  WBC 4.0 - 10.5 K/uL 4.9 -  Hemoglobin 12.0 - 15.0 g/dL 13.7 15.2(H)  Hematocrit 36.0 - 46.0 % 41.0 -  Platelets 150 - 400 K/uL 195 -   CMP Latest Ref Rng & Units 01/13/2021 07/16/2020 05/09/2020  Glucose 65 - 99 mg/dL 95 106(H) 105(H)  BUN 8 - 27 mg/dL 10 10 16   Creatinine 0.57 - 1.00 mg/dL 0.81 0.63 0.79  Sodium 134 - 144 mmol/L 140 132(L) 137  Potassium 3.5 - 5.2 mmol/L 3.9 4.0 4.0  Chloride 96 - 106 mmol/L 101 94(L) 100  CO2 20 - 29 mmol/L 25 26 25   Calcium 8.7 - 10.3 mg/dL 9.3 9.5 9.1  Total Protein 6.0 - 8.5 g/dL 6.3 - -  Total Bilirubin 0.0 - 1.2 mg/dL 0.6 - -  Alkaline Phos 44 - 121 IU/L 89 - -  AST 0 - 40 IU/L 15 - -  ALT 0 - 32 IU/L 9 - -    STUDIES:  No results found.   EXAM: 01/20/2021 DIGITAL SCREENING BILATERAL MAMMOGRAM WITH TOMOSYNTHESIS AND CAD   TECHNIQUE: Bilateral screening digital craniocaudal and mediolateral oblique mammograms were obtained. Bilateral screening digital breast tomosynthesis was performed. The images were evaluated with computer-aided detection.   COMPARISON:  Previous exam(s).   ACR Breast Density  Category c: The breast tissue is heterogeneously dense, which may obscure small masses.   FINDINGS: There are no findings suspicious for malignancy.   IMPRESSION: No mammographic evidence of malignancy.   HISTORY:   Allergies:  Allergies  Allergen Reactions   Ciprofloxacin Other (See Comments)    Tongue swelling and mouth tingling   Nitrofuran Derivatives Swelling   Sulfa Antibiotics Hives   Klor-Con [Potassium Chloride] Other (See Comments)    Cramps and stomach pain   Oxycodone Other (See Comments)    Unknown   Valsartan Palpitations    Pt reports it made my heart race    Current Medications: Current Outpatient Medications  Medication Sig Dispense Refill   Calcium 600-200 MG-UNIT tablet Take 1 tablet by mouth daily.     carvedilol (COREG) 12.5 MG tablet Take 12.5 mg by mouth in the morning and at bedtime.      hydrochlorothiazide (HYDRODIURIL) 12.5 MG tablet Take 1 tablet (12.5 mg total) by mouth daily. (Patient taking differently: Take 12.5 mg by mouth daily. Takes 0.5 tablet ( 6.25 mg ) daily) 90 tablet 3   telmisartan (MICARDIS) 80 MG tablet Take 1 tablet (80 mg total) by mouth daily. 30 tablet 6   No current facility-administered medications for this visit.     ASSESSMENT & PLAN:   Assessment:  1. Stage IA hormone receptor positive left breast cancer, diagnosed in August 2016, treated with lumpectomy. She completed 5 years of hormonal therapy with anastrozole at 50% dose in December 2021.  She remains without evidence of recurrence.    2. Left upper extremity lymphedema, resolved.    3. Left forearm lipoma, which is stable.  4. Neck pain and stiffness for the last 2 weeks. We will obtain x-rays of the cervical spine for further evaluation.   5.  Hypertension. I advised that she recheck her blood pressure once she returns home, and if it remains elevated, follow up with her primary care physician.  Plan:   As above, we will obtain x-ray imaging for the  cervical spine due to her persistent neck pain and stiffness and we will call her on the results. She was on potassium  supplement but this was stopped last month since it had returned to normal, we want to recheck it now that she has been off potassium, so we will draw a CMP today. Otherwise, we will plan to see her back in 1 year for re-examination.  The patient understands the plans discussed today and is in agreement with them.  She knows to contact our office if she develops concerns prior to her next appointment.  I provided 15 minutes of face-to-face time during this this encounter and > 50% was spent counseling as documented under my assessment and plan.    I, Rita Ohara, am acting as scribe for Derwood Kaplan, MD  I have reviewed this report as typed by the medical scribe, and it is complete and accurate.

## 2021-07-23 ENCOUNTER — Telehealth: Payer: Self-pay

## 2021-07-23 ENCOUNTER — Inpatient Hospital Stay: Payer: Medicare PPO | Attending: Oncology | Admitting: Oncology

## 2021-07-23 ENCOUNTER — Inpatient Hospital Stay: Payer: Medicare PPO

## 2021-07-23 ENCOUNTER — Other Ambulatory Visit: Payer: Self-pay

## 2021-07-23 ENCOUNTER — Other Ambulatory Visit: Payer: Self-pay | Admitting: Oncology

## 2021-07-23 VITALS — BP 220/112 | HR 73 | Temp 97.7°F | Resp 16 | Wt 151.0 lb

## 2021-07-23 DIAGNOSIS — M542 Cervicalgia: Secondary | ICD-10-CM | POA: Diagnosis not present

## 2021-07-23 DIAGNOSIS — Z170 Estrogen receptor positive status [ER+]: Secondary | ICD-10-CM

## 2021-07-23 DIAGNOSIS — C50412 Malignant neoplasm of upper-outer quadrant of left female breast: Secondary | ICD-10-CM

## 2021-07-23 NOTE — Telephone Encounter (Signed)
Patient BP elevated in office today. Called patient at home after apt she stated it was slightly lowered but still 193/89. Informed patient per Dr. Hinton Rao please contact PCP and inform of BP readings. Patient agrees and verbalizes understanding.

## 2021-07-23 NOTE — Progress Notes (Signed)
Patient here for oncology follow-up appointment,  concerns of neck pain 2 out of 10

## 2021-07-25 ENCOUNTER — Encounter: Payer: Self-pay | Admitting: Oncology

## 2021-07-25 ENCOUNTER — Telehealth: Payer: Self-pay

## 2021-07-25 NOTE — Telephone Encounter (Signed)
-----   Message from Derwood Kaplan, MD sent at 07/25/2021  3:03 PM EST ----- Regarding: call Tell her she does have significant arthritis of lower neck that explains her neck pain.  Let's send copy of report to her PCP so they can advise what to do

## 2021-07-25 NOTE — Telephone Encounter (Signed)
Patient notified

## 2021-07-25 NOTE — Telephone Encounter (Signed)
Patient notified of xray and faxed to PCP

## 2021-07-29 DIAGNOSIS — C4441 Basal cell carcinoma of skin of scalp and neck: Secondary | ICD-10-CM | POA: Diagnosis not present

## 2021-07-31 ENCOUNTER — Encounter: Payer: Self-pay | Admitting: Oncology

## 2021-08-08 ENCOUNTER — Other Ambulatory Visit: Payer: Self-pay | Admitting: Cardiology

## 2021-09-10 DIAGNOSIS — E1165 Type 2 diabetes mellitus with hyperglycemia: Secondary | ICD-10-CM | POA: Diagnosis not present

## 2021-10-03 ENCOUNTER — Other Ambulatory Visit: Payer: Self-pay | Admitting: Cardiology

## 2021-10-16 DIAGNOSIS — R109 Unspecified abdominal pain: Secondary | ICD-10-CM | POA: Diagnosis not present

## 2021-10-16 DIAGNOSIS — K649 Unspecified hemorrhoids: Secondary | ICD-10-CM | POA: Diagnosis not present

## 2021-10-22 DIAGNOSIS — R109 Unspecified abdominal pain: Secondary | ICD-10-CM | POA: Diagnosis not present

## 2021-10-22 DIAGNOSIS — K5792 Diverticulitis of intestine, part unspecified, without perforation or abscess without bleeding: Secondary | ICD-10-CM | POA: Diagnosis not present

## 2021-10-22 DIAGNOSIS — N3001 Acute cystitis with hematuria: Secondary | ICD-10-CM | POA: Diagnosis not present

## 2021-10-22 DIAGNOSIS — R197 Diarrhea, unspecified: Secondary | ICD-10-CM | POA: Diagnosis not present

## 2021-10-22 DIAGNOSIS — R1013 Epigastric pain: Secondary | ICD-10-CM | POA: Diagnosis not present

## 2021-10-23 DIAGNOSIS — R918 Other nonspecific abnormal finding of lung field: Secondary | ICD-10-CM | POA: Diagnosis not present

## 2021-10-23 DIAGNOSIS — R109 Unspecified abdominal pain: Secondary | ICD-10-CM | POA: Diagnosis not present

## 2021-10-23 DIAGNOSIS — I700 Atherosclerosis of aorta: Secondary | ICD-10-CM | POA: Diagnosis not present

## 2021-11-06 ENCOUNTER — Other Ambulatory Visit: Payer: Self-pay | Admitting: Cardiology

## 2021-11-07 DIAGNOSIS — R350 Frequency of micturition: Secondary | ICD-10-CM | POA: Diagnosis not present

## 2021-11-07 DIAGNOSIS — N3001 Acute cystitis with hematuria: Secondary | ICD-10-CM

## 2021-11-07 DIAGNOSIS — I10 Essential (primary) hypertension: Secondary | ICD-10-CM | POA: Diagnosis not present

## 2021-11-07 DIAGNOSIS — R300 Dysuria: Secondary | ICD-10-CM | POA: Diagnosis not present

## 2021-11-07 HISTORY — DX: Acute cystitis with hematuria: N30.01

## 2021-11-18 ENCOUNTER — Other Ambulatory Visit: Payer: Self-pay | Admitting: Vascular Surgery

## 2021-11-18 DIAGNOSIS — Z1231 Encounter for screening mammogram for malignant neoplasm of breast: Secondary | ICD-10-CM

## 2021-11-20 ENCOUNTER — Other Ambulatory Visit: Payer: Self-pay | Admitting: Cardiology

## 2021-11-20 NOTE — Telephone Encounter (Signed)
Hydrochlorothiazide 12.5 mg # 90 x 1 refills sent to  [id number] - [place], [place] - [address]

## 2021-11-26 DIAGNOSIS — S6991XA Unspecified injury of right wrist, hand and finger(s), initial encounter: Secondary | ICD-10-CM | POA: Diagnosis not present

## 2021-11-26 DIAGNOSIS — W19XXXA Unspecified fall, initial encounter: Secondary | ICD-10-CM | POA: Diagnosis not present

## 2021-11-26 DIAGNOSIS — M25531 Pain in right wrist: Secondary | ICD-10-CM | POA: Diagnosis not present

## 2021-11-28 DIAGNOSIS — S62014A Nondisplaced fracture of distal pole of navicular [scaphoid] bone of right wrist, initial encounter for closed fracture: Secondary | ICD-10-CM | POA: Diagnosis not present

## 2021-12-03 DIAGNOSIS — R300 Dysuria: Secondary | ICD-10-CM | POA: Diagnosis not present

## 2021-12-03 DIAGNOSIS — R3911 Hesitancy of micturition: Secondary | ICD-10-CM | POA: Diagnosis not present

## 2021-12-05 DIAGNOSIS — S62014D Nondisplaced fracture of distal pole of navicular [scaphoid] bone of right wrist, subsequent encounter for fracture with routine healing: Secondary | ICD-10-CM | POA: Diagnosis not present

## 2021-12-09 DIAGNOSIS — N3001 Acute cystitis with hematuria: Secondary | ICD-10-CM | POA: Diagnosis not present

## 2021-12-09 DIAGNOSIS — N3090 Cystitis, unspecified without hematuria: Secondary | ICD-10-CM | POA: Diagnosis not present

## 2021-12-25 DIAGNOSIS — N3020 Other chronic cystitis without hematuria: Secondary | ICD-10-CM | POA: Diagnosis not present

## 2021-12-25 DIAGNOSIS — R8271 Bacteriuria: Secondary | ICD-10-CM | POA: Diagnosis not present

## 2021-12-25 DIAGNOSIS — R300 Dysuria: Secondary | ICD-10-CM | POA: Diagnosis not present

## 2021-12-31 ENCOUNTER — Other Ambulatory Visit: Payer: Self-pay | Admitting: Cardiology

## 2022-01-07 DIAGNOSIS — S62014D Nondisplaced fracture of distal pole of navicular [scaphoid] bone of right wrist, subsequent encounter for fracture with routine healing: Secondary | ICD-10-CM | POA: Diagnosis not present

## 2022-01-08 NOTE — Progress Notes (Signed)
Cardiology Office Note:    Date:  01/09/2022   ID:  Natalie Mack, DOB [birth year], MRN [id number]  PCP:  Ronita Hipps, MD  Cardiologist:  Shirlee More, MD    Referring MD: Ronita Hipps, MD    ASSESSMENT:    1. Essential hypertension   2. Pure hypercholesterolemia   3. Statin intolerance   4. Hyponatremia    PLAN:    In order of problems listed above:  Continue carvedilol high intensity ARB and as opposed to additional medications we discussed good technique checking her home blood pressure and and if above target consider calcium channel blocker amlodipine as I would like to avoid diuretics if possible Again declines lipid-lowering treatment Recheck electrolytes we tried to do a urine spot sodium but not available in our outpatient lab   Next appointment: 1 year   Medication Adjustments/Labs and Tests Ordered: Current medicines are reviewed at length with the patient today.  Concerns regarding medicines are outlined above.  Orders Placed This Encounter  Procedures   Basic Metabolic Panel (BMET)   EKG 12-Lead   Meds ordered this encounter  Medications   losartan (COZAAR) 50 MG tablet    Sig: Take 1 tablet (50 mg total) by mouth daily.    Dispense:  90 tablet    Refill:  3    Chief Complaint  Patient presents with   Annual Exam   Follow-up   Hypertension   Hyperlipidemia    History of Present Illness:    Natalie Mack is a 76 y.o. female with a hx of hypertension hyperlipidemia and increased cardiovascular risk with a history of breast cancer surgery radiation and hormonal block who has declined lipid-lowering treatment last seen 01/13/2021.  Compliance with diet, lifestyle and medications: Yes  Since last seen by me she fractured her wrist is being evaluated for osteoporosis and she had severe hyponatremia with a sodium of 128 in May.  Hydrochlorothiazide was discontinued.  She has not had repeat labs.  Lipid profile 09/10/2021 LDL 159 cholesterol 246  she again declines lipid-lowering treatment. 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   Breast cancer of upper-outer quadrant of left female breast ([facility]) 08/30/2015   Essential hypertension 09/19/2016   Estrogen receptor positive neoplasm 08/30/2015   Hyperlipidemia 02/01/2017   Hypokalemia 02/01/2017   Murmur 01/27/2017   Palpitation 09/19/2016   Overview:  Overview:  Rapid HR, not documented   Statin intolerance 02/01/2017    Past Surgical History:  Procedure Laterality Date   ABDOMINAL HYSTERECTOMY     APPENDECTOMY     CESAREAN SECTION     CHOLECYSTECTOMY     MOHS SURGERY  02/2017   THYROID SURGERY     TONSILLECTOMY      Current Medications: Current Meds  Medication Sig   Calcium 600-200 MG-UNIT tablet Take 1 tablet by mouth daily.   carvedilol (COREG) 12.5 MG tablet Take 12.5 mg by mouth in the morning and at bedtime.    cephALEXin (KEFLEX) 250 MG capsule Take 250 mg by mouth daily.   Lactobacillus (PROBIOTIC ACIDOPHILUS PO) Take 1 capsule by mouth daily.   losartan (COZAAR) 50 MG tablet Take 1 tablet (50 mg total) by mouth daily.   telmisartan (MICARDIS) 80 MG tablet TAKE 1 TABLET BY MOUTH EVERY DAY     Allergies:  Ciprofloxacin, Nitrofuran derivatives, Sulfa antibiotics, Hydrochlorothiazide, Klor-con [potassium chloride], Oxycodone, and Valsartan   Social History   Socioeconomic History   Marital status: Married    Spouse name: Not on file   Number of children: Not on file   Years of education: Not on file   Highest education level: Not on file  Occupational History   Not on file  Tobacco Use   Smoking status: Never   Smokeless tobacco: Never  Vaping Use   Vaping Use: Never used  Substance and Sexual Activity   Alcohol use: No   Drug use: No   Sexual activity: Not on file   Other Topics Concern   Not on file  Social History Narrative   Not on file   Social Determinants of Health   Financial Resource Strain: Not on file  Food Insecurity: Not on file  Transportation Needs: Not on file  Physical Activity: Not on file  Stress: Not on file  Social Connections: Not on file     Family History: The patient's family history includes Heart attack in her maternal grandmother and mother; Heart failure in her mother; Hypertension in her father. ROS:   Please see the history of present illness.    All other systems reviewed and are negative.  EKGs/Labs/Other Studies Reviewed:    The following studies were reviewed today:  EKG:  EKG ordered today and personally reviewed.  The ekg ordered today demonstrates sinus rhythm poor R wave progression unchanged from her previous EKG  Recent Labs: 01/13/2021: ALT 9; BUN 10; Creatinine, Ser 0.81; Potassium 3.9; Sodium 140  Recent Lipid Panel    Component Value Date/Time   CHOL 242 (H) 01/13/2021 1335   TRIG 166 (H) 01/13/2021 1335   HDL 53 01/13/2021 1335   CHOLHDL 4.6 (H) 01/13/2021 1335   LDLCALC 159 (H) 01/13/2021 1335    Physical Exam:    VS:  BP (!) 160/100 (BP Location: Right Arm, Patient Position: Sitting, Cuff Size: Normal)   Pulse 66   Ht 5\' 7"  (1.702 m)   Wt 146 lb (66.2 kg)   SpO2 97%   BMI 22.87 kg/m     Wt Readings from Last 3 Encounters:  01/09/22 146 lb (66.2 kg)  07/23/21 151 lb (68.5 kg)  02/14/21 149 lb (67.6 kg)     GEN:  Well nourished, well developed in no acute distress HEENT: Normal NECK: No JVD; No carotid bruits LYMPHATICS: No lymphadenopathy CARDIAC: RRR, no murmurs, rubs, gallops RESPIRATORY:  Clear to auscultation without rales, wheezing or rhonchi  ABDOMEN: Soft, non-tender, non-distended MUSCULOSKELETAL:  No edema; No deformity  SKIN: Warm and dry NEUROLOGIC:  Alert and oriented x 3 PSYCHIATRIC:  Normal affect    Signed, Shirlee More, MD  01/09/2022 8:21 AM     [place]

## 2022-01-09 ENCOUNTER — Ambulatory Visit: Payer: Medicare PPO | Admitting: Cardiology

## 2022-01-09 ENCOUNTER — Encounter: Payer: Self-pay | Admitting: Cardiology

## 2022-01-09 VITALS — BP 160/100 | HR 66 | Ht 67.0 in | Wt 146.0 lb

## 2022-01-09 DIAGNOSIS — Z789 Other specified health status: Secondary | ICD-10-CM | POA: Diagnosis not present

## 2022-01-09 DIAGNOSIS — E871 Hypo-osmolality and hyponatremia: Secondary | ICD-10-CM

## 2022-01-09 DIAGNOSIS — I10 Essential (primary) hypertension: Secondary | ICD-10-CM | POA: Diagnosis not present

## 2022-01-09 DIAGNOSIS — E7800 Pure hypercholesterolemia, unspecified: Secondary | ICD-10-CM | POA: Diagnosis not present

## 2022-01-09 MED ORDER — LOSARTAN POTASSIUM 50 MG PO TABS: 50.0000 mg | ORAL_TABLET | Freq: Every day | ORAL | 3 refills | Status: DC

## 2022-01-09 NOTE — Patient Instructions (Signed)
Medication Instructions:  Your physician has recommended you make the following change in your medication:   START: Losartan 50 mg daily  *If you need a refill on your cardiac medications before your next appointment, please call your pharmacy*   Lab Work: Your physician recommends that you return for lab work in:   Labs today: BMP  If you have labs (blood work) drawn today and your tests are completely normal, you will receive your results only by: [place] (if you have [facility]) OR A paper copy in the mail If you have any lab test that is abnormal or we need to change your treatment, we will call you to review the results.   Testing/Procedures: None   Follow-Up: At [facility], you and your health needs are our priority.  As part of our continuing mission to provide you with exceptional heart care, we have created designated Provider Care Teams.  These Care Teams include your primary Cardiologist (physician) and Advanced Practice Providers (APPs -  Physician Assistants and Nurse Practitioners) who all work together to provide you with the care you need, when you need it.  We recommend signing up for the patient portal called "MyChart".  Sign up information is provided on this After Visit Summary.  MyChart is used to connect with patients for Virtual Visits (Telemedicine).  Patients are able to view lab/test results, encounter notes, upcoming appointments, etc.  Non-urgent messages can be sent to your provider as well.   To learn more about what you can do with MyChart, go to [email or web address].    Your next appointment:   1 year(s)  The format for your next appointment:   In Person  Provider:   Shirlee More, MD    Other Instructions None  Important Information About Sugar

## 2022-01-10 LAB — BASIC METABOLIC PANEL
BUN/Creatinine Ratio: 19 (ref 12–28)
BUN: 14 mg/dL (ref 8–27)
CO2: 24 mmol/L (ref 20–29)
Calcium: 9.3 mg/dL (ref 8.7–10.3)
Chloride: 101 mmol/L (ref 96–106)
Creatinine, Ser: 0.74 mg/dL (ref 0.57–1.00)
Glucose: 118 mg/dL — ABNORMAL HIGH (ref 70–99)
Potassium: 4 mmol/L (ref 3.5–5.2)
Sodium: 139 mmol/L (ref 134–144)
eGFR: 83 mL/min/{1.73_m2} (ref >59)

## 2022-01-21 ENCOUNTER — Ambulatory Visit
Admission: RE | Admit: 2022-01-21 | Discharge: 2022-01-21 | Disposition: A | Discharge status: Home / Self Care | Payer: Medicare PPO | Source: Ambulatory Visit | Attending: Vascular Surgery | Admitting: Vascular Surgery

## 2022-01-21 DIAGNOSIS — Z1231 Encounter for screening mammogram for malignant neoplasm of breast: Secondary | ICD-10-CM

## 2022-01-29 DIAGNOSIS — M810 Age-related osteoporosis without current pathological fracture: Secondary | ICD-10-CM | POA: Diagnosis not present

## 2022-02-02 DIAGNOSIS — Z170 Estrogen receptor positive status [ER+]: Secondary | ICD-10-CM | POA: Diagnosis not present

## 2022-02-02 DIAGNOSIS — C50412 Malignant neoplasm of upper-outer quadrant of left female breast: Secondary | ICD-10-CM | POA: Diagnosis not present

## 2022-02-03 ENCOUNTER — Other Ambulatory Visit: Payer: Self-pay | Admitting: Cardiology

## 2022-02-03 NOTE — Telephone Encounter (Signed)
Telmisartan 80 mg # 90 x 3 refills sent to  [id number] - [place], [place] - [address]

## 2022-02-17 ENCOUNTER — Telehealth: Payer: Self-pay | Admitting: Cardiology

## 2022-02-17 NOTE — Telephone Encounter (Signed)
Pt c/o medication issue:  1. Name of Medication:  telmisartan (MICARDIS) 80 MG tablet   losartan (COZAAR) 50 MG tablet  2. How are you currently taking this medication (dosage and times per day)?   3. Are you having a reaction (difficulty breathing--STAT)?   4. What is your medication issue?   Patient states the pharmacy advised her she should not be taking these medications together.

## 2022-02-18 ENCOUNTER — Other Ambulatory Visit: Payer: Self-pay

## 2022-02-18 NOTE — Telephone Encounter (Signed)
Called patient and informed her that Dr. Bettina Gavia would like for her to contact her PCP regarding the headaches and her blood pressure readings. Patient was agreeable with this plan and had no further questions at this time.

## 2022-02-18 NOTE — Telephone Encounter (Signed)
Called patient and she reported that she has been having headaches and her blood pressure has been elevated over the past few days. The last few days blood pressures are: 150/78, 152/78, 164/87 and 175/90.

## 2022-02-18 NOTE — Telephone Encounter (Signed)
Patient is calling back due to not hearing back in regards to this. Please advise.

## 2022-02-18 NOTE — Telephone Encounter (Signed)
Called the patient and informed her of Dr. Joya Gaskins recommendation to stop Cozaar. Patient was agreeable with this plan and had no further questions at this time.

## 2022-02-18 NOTE — Telephone Encounter (Signed)
Patient states the pharmacy advised her she should not be taking these medications together.

## 2022-02-24 DIAGNOSIS — E559 Vitamin D deficiency, unspecified: Secondary | ICD-10-CM | POA: Diagnosis not present

## 2022-03-04 DIAGNOSIS — R3121 Asymptomatic microscopic hematuria: Secondary | ICD-10-CM | POA: Diagnosis not present

## 2022-03-04 DIAGNOSIS — N3020 Other chronic cystitis without hematuria: Secondary | ICD-10-CM | POA: Diagnosis not present

## 2022-03-16 DIAGNOSIS — Z79899 Other long term (current) drug therapy: Secondary | ICD-10-CM | POA: Diagnosis not present

## 2022-03-16 DIAGNOSIS — E1165 Type 2 diabetes mellitus with hyperglycemia: Secondary | ICD-10-CM | POA: Diagnosis not present

## 2022-03-16 DIAGNOSIS — Z0000 Encounter for general adult medical examination without abnormal findings: Secondary | ICD-10-CM | POA: Diagnosis not present

## 2022-03-16 DIAGNOSIS — I10 Essential (primary) hypertension: Secondary | ICD-10-CM | POA: Diagnosis not present

## 2022-03-16 DIAGNOSIS — Z6823 Body mass index (BMI) 23.0-23.9, adult: Secondary | ICD-10-CM | POA: Diagnosis not present

## 2022-03-16 DIAGNOSIS — Z1331 Encounter for screening for depression: Secondary | ICD-10-CM | POA: Diagnosis not present

## 2022-04-20 DIAGNOSIS — I10 Essential (primary) hypertension: Secondary | ICD-10-CM | POA: Diagnosis not present

## 2022-04-22 DIAGNOSIS — R3915 Urgency of urination: Secondary | ICD-10-CM | POA: Diagnosis not present

## 2022-04-22 DIAGNOSIS — R3121 Asymptomatic microscopic hematuria: Secondary | ICD-10-CM | POA: Diagnosis not present

## 2022-05-20 DIAGNOSIS — I10 Essential (primary) hypertension: Secondary | ICD-10-CM | POA: Diagnosis not present

## 2022-07-01 DIAGNOSIS — I10 Essential (primary) hypertension: Secondary | ICD-10-CM | POA: Diagnosis not present

## 2022-07-21 NOTE — Progress Notes (Addendum)
[name]  [address] [place],  [place]  [address] [phone]  Clinic Day: 07/23/22  Referring physician: Ronita Hipps, MD   CHIEF COMPLAINT:  CC:  History of stage IA hormone receptor positive breast cancer  Current Treatment:  Observation   HISTORY OF PRESENT ILLNESS:  Natalie Mack is a 77 y.o. 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  She was treated with lumpectomy.  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  2 sentinel nodes were negative for metastasis.  A medial margin was positive, so she underwent re-excision, which did not reveal any residual cancer.  Estrogen and progesterone receptors were positive and HER 2/Neu negative.  Ki 67 was 5%.  We discussed the option of Oncotype DX, but she felt she would not be interested in chemotherapy regardless, so this was not pursued.  She received adjuvant radiation to the left breast completed in November 2016.  Bone density scan in July 2016 was normal.  She was placed on anastrozole 1 mg daily in December 2016.  She had difficulty tolerating anastrozole 1 mg daily due to arthralgias, vaginal pressure and non refreshing sleep.  She discontinued anastrozole after about 2 months, then later started taking half a pill daily, which she has tolerated fairly well.  Repeat bone density scan in August 2018 remained normal, with just slight worsening since 2008.  The patient continues on calcium and vitamin-D.  Bilateral diagnostic mammogram in August 2020 did not reveal any evidence of malignancy. She completed 5 years of anastrozole at 50% dose in December 2021.  She had an enlarging lipoma that is 3-4 cm in diameter in the left forearm, but on the palmar surface of her wrist, she had a cyst measuring 2 cm long, which had  enlarged. We recommended excision when we saw her in July 2021. At her visit in February 2022, she had recently had 2 new basal cell carcinomas of the skin of her back and scalp and was to undergo excision of these.  She had not had the cyst of the left wrist excised, which seemed to be growing slowly.  She stated Dr. Noberto Retort is monitoring this as well. 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 She has routine labs drawn at her primary care office. She was seen for an extra appointment in September 2022 for some swelling of the arm which was felt to be mild lymphedema.  INTERVAL HISTORY:  Natalie Mack is here for routine follow up and states that she has been well. She had a CT abdomen and pelvis in May of 2023 to evaluate acute abdominal pain and this was negative. She did have a fall last June 2023, but no severe injury.  Her last colonoscopy was in July of 2022, with  3 diminutive polyps. Annual mammogram from August 2023 was clear and she will follow up with Dr. Noberto Retort. Her  appetite is good, and she has gained 6 pounds since her last visit.  She denies fever, chills or other signs of infection.  She denies nausea, vomiting, bowel issues, or abdominal pain.  She denies sore throat, cough, dyspnea, or chest pain.  REVIEW OF SYSTEMS:  Review of Systems  Constitutional: Negative.  Negative for appetite change, chills, fatigue, fever and unexpected weight change.  HENT:  Negative.    Eyes: Negative.   Respiratory: Negative.  Negative for chest tightness, cough, hemoptysis, shortness of breath and wheezing.   Cardiovascular: Negative.  Negative for chest pain, leg swelling and palpitations.  Gastrointestinal: Negative.  Negative for abdominal distention, abdominal pain, blood in stool, constipation, diarrhea, nausea and vomiting.  Endocrine: Negative.   Genitourinary: Negative.  Negative for difficulty urinating, dysuria, frequency and hematuria.    Musculoskeletal:  Positive for neck pain and neck stiffness. Negative for arthralgias, back pain, flank pain, gait problem and myalgias.  Skin: Negative.   Neurological: Negative.  Negative for dizziness, extremity weakness, gait problem, headaches, light-headedness, numbness, seizures and speech difficulty.  Hematological: Negative.   Psychiatric/Behavioral: Negative.  Negative for depression and sleep disturbance. The patient is not nervous/anxious.      VITALS:  Blood pressure (!) 177/89, pulse 72, temperature 98.4 F (36.9 C), temperature source Oral, resp. rate 16, height '5\' 7"'$  (1.702 m), weight 152 lb 9.6 oz (69.2 kg), SpO2 99 %.  Wt Readings from Last 3 Encounters:  07/23/22 152 lb 9.6 oz (69.2 kg)  01/09/22 146 lb (66.2 kg)  07/23/21 151 lb (68.5 kg)    Body mass index is 23.9 kg/m.  Performance status (ECOG): 1 - Symptomatic but completely ambulatory  PHYSICAL EXAM:  Physical Exam Constitutional:      General: She is not in acute distress.    Appearance: Normal appearance. She is normal weight.  HENT:     Head: Normocephalic and atraumatic.  Eyes:     General: No scleral icterus.    Extraocular Movements: Extraocular movements intact.     Conjunctiva/sclera: Conjunctivae normal.     Pupils: Pupils are equal, round, and reactive to light.  Cardiovascular:     Rate and Rhythm: Normal rate and regular rhythm.     Pulses: Normal pulses.     Heart sounds: Normal heart sounds. No murmur heard.    No friction rub. No gallop.  Pulmonary:     Effort: Pulmonary effort is normal. No respiratory distress.     Breath sounds: Normal breath sounds.  Chest:     Comments: Deep scar of the left nipple which is well healed. Well healed left axillary incision. Lower half of the right breast has scattered fibrocystic changes. No masses in either breast. Abdominal:     General: Bowel sounds are normal. There is no distension.     Palpations: Abdomen is soft. There is no hepatomegaly,  splenomegaly or mass.     Tenderness: There is no abdominal tenderness.  Musculoskeletal:        General: Normal range of motion.     Cervical back: Normal range of motion and neck supple.     Right lower leg: No edema.     Left lower leg: No edema.  Lymphadenopathy:     Cervical: No cervical adenopathy.  Skin:    General: Skin is warm and dry.  Neurological:     General: No focal deficit present.     Mental Status: She is alert and oriented to person, place, and time. Mental status is at baseline.  Psychiatric:        Mood and Affect: Mood normal.        Behavior: Behavior normal.        Thought Content: Thought content normal.        Judgment: Judgment normal.    LABS:      Latest Ref Rng & Units 10/23/2017    6:57 PM  12/26/2009    8:00 AM  CBC  WBC 4.0 - 10.5 K/uL 4.9    Hemoglobin 12.0 - 15.0 g/dL 13.7  15.2   Hematocrit 36.0 - 46.0 % 41.0    Platelets 150 - 400 K/uL 195        Latest Ref Rng & Units 01/09/2022    9:00 AM 01/13/2021    1:35 PM 07/16/2020    1:37 PM  CMP  Glucose 70 - 99 mg/dL 118  95  106   BUN 8 - 27 mg/dL '14  10  10   '$ Creatinine 0.57 - 1.00 mg/dL 0.74  0.81  0.63   Sodium 134 - 144 mmol/L 139  140  132   Potassium 3.5 - 5.2 mmol/L 4.0  3.9  4.0   Chloride 96 - 106 mmol/L 101  101  94   CO2 20 - 29 mmol/L '24  25  26   '$ Calcium 8.7 - 10.3 mg/dL 9.3  9.3  9.5   Total Protein 6.0 - 8.5 g/dL  6.3    Total Bilirubin 0.0 - 1.2 mg/dL  0.6    Alkaline Phos 44 - 121 IU/L  89    AST 0 - 40 IU/L  15    ALT 0 - 32 IU/L  9      STUDIES:   EXAM:01/22/2022 DIGITAL SCREENING BILATERAL MAMMOGRAM WITH TOMOSYNTHESIS AND CAD   TECHNIQUE: Bilateral screening digital craniocaudal and mediolateral oblique mammograms were obtained. Bilateral screening digital breast tomosynthesis was performed. The images were evaluated with computer-aided detection.   COMPARISON:  Previous exam(s).   ACR Breast Density Category c: The breast tissue is heterogeneously dense,  which may obscure small masses.   FINDINGS: There are no findings suspicious for malignancy.   IMPRESSION: No mammographic evidence of malignancy. A result letter of this screening mammogram will be mailed directly to the patient.   RECOMMENDATION: Screening mammogram in one year. (Code:SM-B-01Y)   BI-RADS CATEGORY  1: Negative.  HISTORY:   Allergies:  Allergies  Allergen Reactions   Ciprofloxacin Other (See Comments)    Tongue swelling and mouth tingling   Nitrofuran Derivatives Swelling   Sulfa Antibiotics Hives   Potassium Chloride Other (See Comments)    Cramps and stomach pain   Hydrochlorothiazide Other (See Comments)   Oxycodone Other (See Comments)    Unknown   Valsartan Palpitations    Pt reports it made my heart race    Current Medications: Current Outpatient Medications  Medication Sig Dispense Refill   Calcium 600-200 MG-UNIT tablet Take 1 tablet by mouth daily.     carvedilol (COREG) 12.5 MG tablet Take 12.5 mg by mouth in the morning and at bedtime.      cholecalciferol (VITAMIN D3) 25 MCG (1000 UNIT) tablet Take 1,000 Units by mouth daily.     Lactobacillus (PROBIOTIC ACIDOPHILUS PO) Take 1 capsule by mouth daily.     telmisartan (MICARDIS) 80 MG tablet TAKE 1 TABLET BY MOUTH EVERY DAY 90 tablet 3   No current facility-administered medications for this visit.     ASSESSMENT & PLAN:   Assessment:  1. Stage IA hormone receptor positive left breast cancer, diagnosed in August 2016, treated with lumpectomy. She completed 5 years of hormonal therapy with anastrozole at 50% dose in December 2021.  She remains without evidence of recurrence.    2. Left upper extremity lymphedema, resolved.    Plan:   I will plan to see her back in 1 year for re-examination. She  will have annual screening mammogram in August and follow up with Dr. Noberto Retort. The patient understands the plans discussed today and is in agreement with them.  She knows to contact our office if  she develops concerns prior to her next appointment.  I provided 15 minutes of face-to-face time during this this encounter and > 50% was spent counseling as documented under my assessment and plan.     I,Gabriella Ballesteros,acting as a scribe for Derwood Kaplan, MD.,have documented all relevant documentation on the behalf of Derwood Kaplan, MD,as directed by  Derwood Kaplan, MD while in the presence of Derwood Kaplan, MD.

## 2022-07-23 ENCOUNTER — Encounter: Payer: Self-pay | Admitting: Oncology

## 2022-07-23 ENCOUNTER — Inpatient Hospital Stay: Payer: Medicare PPO | Attending: Oncology | Admitting: Oncology

## 2022-07-23 VITALS — BP 177/89 | HR 72 | Temp 98.4°F | Resp 16 | Ht 67.0 in | Wt 152.6 lb

## 2022-07-23 DIAGNOSIS — C50412 Malignant neoplasm of upper-outer quadrant of left female breast: Secondary | ICD-10-CM | POA: Diagnosis not present

## 2022-07-23 DIAGNOSIS — Z170 Estrogen receptor positive status [ER+]: Secondary | ICD-10-CM | POA: Diagnosis not present

## 2022-07-23 DIAGNOSIS — D499 Neoplasm of unspecified behavior of unspecified site: Secondary | ICD-10-CM | POA: Diagnosis not present

## 2022-08-11 DIAGNOSIS — M71342 Other bursal cyst, left hand: Secondary | ICD-10-CM | POA: Diagnosis not present

## 2022-08-11 DIAGNOSIS — L821 Other seborrheic keratosis: Secondary | ICD-10-CM | POA: Diagnosis not present

## 2022-08-11 DIAGNOSIS — Z85828 Personal history of other malignant neoplasm of skin: Secondary | ICD-10-CM | POA: Diagnosis not present

## 2022-08-11 DIAGNOSIS — D485 Neoplasm of uncertain behavior of skin: Secondary | ICD-10-CM | POA: Diagnosis not present

## 2022-08-11 DIAGNOSIS — D220 Melanocytic nevi of lip: Secondary | ICD-10-CM | POA: Diagnosis not present

## 2022-08-11 DIAGNOSIS — L570 Actinic keratosis: Secondary | ICD-10-CM | POA: Diagnosis not present

## 2022-08-11 DIAGNOSIS — L814 Other melanin hyperpigmentation: Secondary | ICD-10-CM | POA: Diagnosis not present

## 2022-08-11 DIAGNOSIS — D1801 Hemangioma of skin and subcutaneous tissue: Secondary | ICD-10-CM | POA: Diagnosis not present

## 2022-08-11 DIAGNOSIS — D2261 Melanocytic nevi of right upper limb, including shoulder: Secondary | ICD-10-CM | POA: Diagnosis not present

## 2022-08-11 DIAGNOSIS — C4441 Basal cell carcinoma of skin of scalp and neck: Secondary | ICD-10-CM | POA: Diagnosis not present

## 2022-08-11 DIAGNOSIS — C44311 Basal cell carcinoma of skin of nose: Secondary | ICD-10-CM | POA: Diagnosis not present

## 2022-08-17 DIAGNOSIS — H35372 Puckering of macula, left eye: Secondary | ICD-10-CM | POA: Diagnosis not present

## 2022-08-17 DIAGNOSIS — D3132 Benign neoplasm of left choroid: Secondary | ICD-10-CM | POA: Diagnosis not present

## 2022-08-17 DIAGNOSIS — H35363 Drusen (degenerative) of macula, bilateral: Secondary | ICD-10-CM | POA: Diagnosis not present

## 2022-08-17 DIAGNOSIS — H2513 Age-related nuclear cataract, bilateral: Secondary | ICD-10-CM | POA: Diagnosis not present

## 2022-08-17 DIAGNOSIS — E119 Type 2 diabetes mellitus without complications: Secondary | ICD-10-CM | POA: Diagnosis not present

## 2022-08-17 DIAGNOSIS — H40013 Open angle with borderline findings, low risk, bilateral: Secondary | ICD-10-CM | POA: Diagnosis not present

## 2022-09-09 DIAGNOSIS — K5790 Diverticulosis of intestine, part unspecified, without perforation or abscess without bleeding: Secondary | ICD-10-CM | POA: Diagnosis not present

## 2022-09-09 DIAGNOSIS — K219 Gastro-esophageal reflux disease without esophagitis: Secondary | ICD-10-CM | POA: Diagnosis not present

## 2022-09-10 DIAGNOSIS — E119 Type 2 diabetes mellitus without complications: Secondary | ICD-10-CM | POA: Diagnosis not present

## 2022-09-10 DIAGNOSIS — Z881 Allergy status to other antibiotic agents status: Secondary | ICD-10-CM | POA: Diagnosis not present

## 2022-09-10 DIAGNOSIS — Z882 Allergy status to sulfonamides status: Secondary | ICD-10-CM | POA: Diagnosis not present

## 2022-09-10 DIAGNOSIS — Z885 Allergy status to narcotic agent status: Secondary | ICD-10-CM | POA: Diagnosis not present

## 2022-09-10 DIAGNOSIS — Z800 Family history of malignant neoplasm of digestive organs: Secondary | ICD-10-CM | POA: Diagnosis not present

## 2022-09-10 DIAGNOSIS — Z853 Personal history of malignant neoplasm of breast: Secondary | ICD-10-CM | POA: Diagnosis not present

## 2022-09-10 DIAGNOSIS — Z85828 Personal history of other malignant neoplasm of skin: Secondary | ICD-10-CM | POA: Diagnosis not present

## 2022-09-10 DIAGNOSIS — K219 Gastro-esophageal reflux disease without esophagitis: Secondary | ICD-10-CM | POA: Diagnosis not present

## 2022-09-10 DIAGNOSIS — I10 Essential (primary) hypertension: Secondary | ICD-10-CM | POA: Diagnosis not present

## 2022-09-10 DIAGNOSIS — R0789 Other chest pain: Secondary | ICD-10-CM | POA: Diagnosis not present

## 2022-09-10 DIAGNOSIS — K449 Diaphragmatic hernia without obstruction or gangrene: Secondary | ICD-10-CM | POA: Diagnosis not present

## 2022-09-14 ENCOUNTER — Telehealth: Payer: Self-pay | Admitting: Cardiology

## 2022-09-14 NOTE — Telephone Encounter (Signed)
Called patient and she reported that she started having chest pain on the right side of her chest about 6 weeks ago. It usually happens about 2-3 times per week. She thought it was indigestion and went to a GI physician that did an upper endoscopy and they found nothing wrong per the patient. This last Thursday she states that the pain starts on her lower right side and then progresses up to her neck area and it comes and goes in waves. The pain lasted for 2 hours. On Friday she had the same pain and it lasted for 3 - 4 hours. Please advise.

## 2022-09-14 NOTE — Telephone Encounter (Signed)
Pt c/o of Chest Pain: STAT if CP now or developed within 24 hours  1. Are you having CP right now?   No  2. Are you experiencing any other symptoms (ex. SOB, nausea, vomiting, sweating)?   Feels sick in her stomach and has not eaten yet today  3. How long have you been experiencing CP?   Started 6 weeks ago  4. Is your CP continuous or coming and going?   Comes and goes.  Patient noted when she stops and sits down the pain goes away  5. Have you taken Nitroglycerin?   No.  Not taking  Patient stated she does not feel well and had several episodes which started 6 weeks ago. Patient stated she originally thought it was indigestion as she got over the feeling but is concerned this has been continuing.

## 2022-09-15 ENCOUNTER — Other Ambulatory Visit: Payer: Self-pay

## 2022-09-15 MED ORDER — ASPIRIN 81 MG PO TBEC: 81.0000 mg | DELAYED_RELEASE_TABLET | Freq: Every day | ORAL | 3 refills | Status: DC

## 2022-09-15 MED ORDER — NITROGLYCERIN 0.4 MG SL SUBL: 0.4000 mg | SUBLINGUAL_TABLET | SUBLINGUAL | 1 refills | Status: AC | PRN

## 2022-09-15 NOTE — Telephone Encounter (Signed)
Pt returning call

## 2022-09-15 NOTE — Telephone Encounter (Signed)
Called patient and informed her of Dr. Kem Parkinson recommendation below:  "For chest pain recurring she has to go to the emergency room.  Make sure she takes a coated baby aspirin daily.  Also nitroglycerin sublingual as needed.  She needs to see Dr. Dulce Sellar ASAP an appointment.  If there are no slots I will be glad to see her."  Prescriptions for Nitroglycerin and Aspirin 81 mg enteric coated ordered via Epic and sent to the patient's pharmacy. Patient did not want to go to the ER. Patient stated she was feeling fine now. An appointment was scheduled for her to see Dr. Dulce Sellar on 09/21/22 at 8:40 am. Patient had no further questions at this time.

## 2022-09-15 NOTE — Telephone Encounter (Signed)
Left message for the patient to call back.

## 2022-09-16 DIAGNOSIS — R079 Chest pain, unspecified: Secondary | ICD-10-CM | POA: Diagnosis not present

## 2022-09-16 DIAGNOSIS — Z923 Personal history of irradiation: Secondary | ICD-10-CM | POA: Diagnosis not present

## 2022-09-16 DIAGNOSIS — I441 Atrioventricular block, second degree: Secondary | ICD-10-CM | POA: Diagnosis not present

## 2022-09-16 DIAGNOSIS — K219 Gastro-esophageal reflux disease without esophagitis: Secondary | ICD-10-CM | POA: Diagnosis not present

## 2022-09-16 DIAGNOSIS — I440 Atrioventricular block, first degree: Secondary | ICD-10-CM | POA: Diagnosis not present

## 2022-09-16 DIAGNOSIS — R9431 Abnormal electrocardiogram [ECG] [EKG]: Secondary | ICD-10-CM | POA: Diagnosis not present

## 2022-09-16 DIAGNOSIS — E041 Nontoxic single thyroid nodule: Secondary | ICD-10-CM | POA: Diagnosis not present

## 2022-09-16 DIAGNOSIS — Z853 Personal history of malignant neoplasm of breast: Secondary | ICD-10-CM | POA: Diagnosis not present

## 2022-09-16 DIAGNOSIS — I10 Essential (primary) hypertension: Secondary | ICD-10-CM | POA: Diagnosis not present

## 2022-09-16 DIAGNOSIS — E785 Hyperlipidemia, unspecified: Secondary | ICD-10-CM | POA: Diagnosis not present

## 2022-09-16 DIAGNOSIS — R911 Solitary pulmonary nodule: Secondary | ICD-10-CM | POA: Diagnosis not present

## 2022-09-16 DIAGNOSIS — E871 Hypo-osmolality and hyponatremia: Secondary | ICD-10-CM | POA: Diagnosis not present

## 2022-09-16 DIAGNOSIS — I160 Hypertensive urgency: Secondary | ICD-10-CM | POA: Diagnosis not present

## 2022-09-16 DIAGNOSIS — I959 Hypotension, unspecified: Secondary | ICD-10-CM | POA: Diagnosis not present

## 2022-09-17 DIAGNOSIS — I10 Essential (primary) hypertension: Secondary | ICD-10-CM | POA: Diagnosis not present

## 2022-09-17 DIAGNOSIS — R079 Chest pain, unspecified: Secondary | ICD-10-CM | POA: Diagnosis not present

## 2022-09-17 DIAGNOSIS — R911 Solitary pulmonary nodule: Secondary | ICD-10-CM

## 2022-09-17 DIAGNOSIS — E042 Nontoxic multinodular goiter: Secondary | ICD-10-CM | POA: Insufficient documentation

## 2022-09-17 DIAGNOSIS — I959 Hypotension, unspecified: Secondary | ICD-10-CM | POA: Diagnosis not present

## 2022-09-17 DIAGNOSIS — E785 Hyperlipidemia, unspecified: Secondary | ICD-10-CM | POA: Diagnosis not present

## 2022-09-17 DIAGNOSIS — I160 Hypertensive urgency: Secondary | ICD-10-CM | POA: Diagnosis not present

## 2022-09-17 DIAGNOSIS — I441 Atrioventricular block, second degree: Secondary | ICD-10-CM | POA: Diagnosis not present

## 2022-09-17 HISTORY — DX: Nontoxic multinodular goiter: E04.2

## 2022-09-17 HISTORY — DX: Solitary pulmonary nodule: R91.1

## 2022-09-21 ENCOUNTER — Ambulatory Visit: Payer: Medicare PPO | Admitting: Cardiology

## 2022-09-23 DIAGNOSIS — E871 Hypo-osmolality and hyponatremia: Secondary | ICD-10-CM | POA: Diagnosis not present

## 2022-09-23 DIAGNOSIS — Z6822 Body mass index (BMI) 22.0-22.9, adult: Secondary | ICD-10-CM | POA: Diagnosis not present

## 2022-09-23 DIAGNOSIS — E041 Nontoxic single thyroid nodule: Secondary | ICD-10-CM | POA: Diagnosis not present

## 2022-09-28 DIAGNOSIS — C44311 Basal cell carcinoma of skin of nose: Secondary | ICD-10-CM | POA: Diagnosis not present

## 2022-09-28 DIAGNOSIS — Z85828 Personal history of other malignant neoplasm of skin: Secondary | ICD-10-CM | POA: Diagnosis not present

## 2022-10-05 DIAGNOSIS — C44519 Basal cell carcinoma of skin of other part of trunk: Secondary | ICD-10-CM | POA: Diagnosis not present

## 2022-10-07 DIAGNOSIS — E041 Nontoxic single thyroid nodule: Secondary | ICD-10-CM | POA: Diagnosis not present

## 2022-10-14 DIAGNOSIS — L0889 Other specified local infections of the skin and subcutaneous tissue: Secondary | ICD-10-CM | POA: Diagnosis not present

## 2022-10-14 DIAGNOSIS — E041 Nontoxic single thyroid nodule: Secondary | ICD-10-CM | POA: Diagnosis not present

## 2022-10-14 DIAGNOSIS — L0101 Non-bullous impetigo: Secondary | ICD-10-CM | POA: Diagnosis not present

## 2022-10-14 DIAGNOSIS — Z85828 Personal history of other malignant neoplasm of skin: Secondary | ICD-10-CM | POA: Diagnosis not present

## 2022-10-14 NOTE — Progress Notes (Unsigned)
Cardiology Office Note:    Date:  10/14/2022   ID:  Natalie, Mack [birth year], MRN [id number]  PCP:  Marylen Ponto, MD   [facility] Providers Cardiologist:  None { Click to update primary MD,subspecialty MD or APP then REFRESH:1}    Referring MD: Marylen Ponto, MD    History of Present Illness:    Natalie Mack is a 77 y.o. female with a hx of hypertension, breast cancer 2016, palpitations, murmur, hyperlipidemia with statin intolerance.  In November 2022 she wore a monitor which revealed an average heart rate of 74 bpm, predominant underlying rhythm was sinus, first-degree AV block was present, 28 runs of SVT were noted, some episodes could have been atrial tachycardia with variable block.  Most recently evaluated in the office by Dr. Dulce Sellar on 01/09/2022 at this time she was doing well from a cardiac perspective.  Lipid lowering medications were discussed again however she ultimately declined.   She called our triage line on 09/14/2022 complaining of chest pain, she was instructed to follow-up with the ED.  She presented to [facility] on 09/16/2022 with a 6-week history of atypical chest pain. She had an MPI at [facility] on 09/16/2022, she had a hypotensive reaction, systolic blood pressure felt to the less than 80 systolic with transient 2-1 AV block.  She had apparently been given hydralazine prior to the study, had a vagal episode, she quickly recovered with a fluid challenge and elevation of her legs.  Her MPI was negative for ischemia.  She had an echocardiogram on 09/16/2022 at [facility], EF 60 to 65%, LV restrictive pattern was observed, no valvular abnormalities, RV systolic pressure could not be estimated.    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   Breast cancer of upper-outer quadrant of  left female breast (HCC) 08/30/2015   Essential hypertension 09/19/2016   Estrogen receptor positive neoplasm 08/30/2015   Hyperlipidemia 02/01/2017   Hypokalemia 02/01/2017   Murmur 01/27/2017   Palpitation 09/19/2016   Overview:  Overview:  Rapid HR, not documented   Statin intolerance 02/01/2017    Past Surgical History:  Procedure Laterality Date   ABDOMINAL HYSTERECTOMY     APPENDECTOMY     CESAREAN SECTION     CHOLECYSTECTOMY     MOHS SURGERY  02/2017   THYROID SURGERY     TONSILLECTOMY      Current Medications: No outpatient medications have been marked as taking for the 10/15/22 encounter (Appointment) with Flossie Dibble, NP.     Allergies:   Ciprofloxacin, Nitrofuran derivatives, Sulfa antibiotics, Potassium chloride, Hydrochlorothiazide, Oxycodone, and Valsartan   Social History   Socioeconomic History   Marital status: Married    Spouse name: Not on file   Number of children: Not on file   Years of education: Not on file   Highest education level: Not on file  Occupational History   Not on file  Tobacco Use   Smoking status: Never   Smokeless tobacco: Never  Vaping Use   Vaping Use: Never used  Substance and Sexual Activity   Alcohol use: No   Drug use: No   Sexual activity: Not on file  Other Topics Concern   Not on file  Social History Narrative   Not on file   Social Determinants of Health  Financial Resource Strain: Not on file  Food Insecurity: Not on file  Transportation Needs: Not on file  Physical Activity: Not on file  Stress: Not on file  Social Connections: Not on file     Family History: The patient's ***family history includes Heart attack in her maternal grandmother and mother; Heart failure in her mother; Hypertension in her father.  ROS:   Please see the history of present illness.    *** All other systems reviewed and are negative.  EKGs/Labs/Other Studies Reviewed:    The following studies were reviewed today: ***  EKG:   EKG is *** ordered today.  The ekg ordered today demonstrates ***  Recent Labs: 01/09/2022: BUN 14; Creatinine, Ser 0.74; Potassium 4.0; Sodium 139  Recent Lipid Panel    Component Value Date/Time   CHOL 242 (H) 01/13/2021 1335   TRIG 166 (H) 01/13/2021 1335   HDL 53 01/13/2021 1335   CHOLHDL 4.6 (H) 01/13/2021 1335   LDLCALC 159 (H) 01/13/2021 1335     Risk Assessment/Calculations:   {Does this patient have ATRIAL FIBRILLATION?[phone]}  No BP recorded.  {Refresh Note OR Click here to enter BP  :1}***         Physical Exam:    VS:  There were no vitals taken for this visit.    Wt Readings from Last 3 Encounters:  07/23/22 152 lb 9.6 oz (69.2 kg)  01/09/22 146 lb (66.2 kg)  07/23/21 151 lb (68.5 kg)     GEN: *** Well nourished, well developed in no acute distress HEENT: Normal NECK: No JVD; No carotid bruits LYMPHATICS: No lymphadenopathy CARDIAC: ***RRR, no murmurs, rubs, gallops RESPIRATORY:  Clear to auscultation without rales, wheezing or rhonchi  ABDOMEN: Soft, non-tender, non-distended MUSCULOSKELETAL:  No edema; No deformity  SKIN: Warm and dry NEUROLOGIC:  Alert and oriented x 3 PSYCHIATRIC:  Normal affect   ASSESSMENT:    1. Essential hypertension   2. Pure hypercholesterolemia   3. Statin intolerance   4. Hyponatremia   5. Palpitations   6. Precordial pain    PLAN:    In order of problems listed above:  Precordial pain -  Hypertension -  Hyperlipidemia intolerant to statins -  Hyponatremia Palpitations      {Are you ordering a CV Procedure (e.g. stress test, cath, DCCV, TEE, etc)?   Press F2        :[id number]}    Medication Adjustments/Labs and Tests Ordered: Current medicines are reviewed at length with the patient today.  Concerns regarding medicines are outlined above.  No orders of the defined types were placed in this encounter.  No orders of the defined types were placed in this encounter.   There are no Patient Instructions  on file for this visit.   Signed, Flossie Dibble, NP  10/14/2022 3:44 PM    [facility]

## 2022-10-15 ENCOUNTER — Ambulatory Visit: Payer: Medicare PPO | Attending: Cardiology | Admitting: Cardiology

## 2022-10-15 ENCOUNTER — Encounter: Payer: Self-pay | Admitting: Cardiology

## 2022-10-15 VITALS — BP 159/87 | HR 60 | Ht 67.0 in | Wt 146.0 lb

## 2022-10-15 DIAGNOSIS — E871 Hypo-osmolality and hyponatremia: Secondary | ICD-10-CM | POA: Diagnosis not present

## 2022-10-15 DIAGNOSIS — R002 Palpitations: Secondary | ICD-10-CM

## 2022-10-15 DIAGNOSIS — N289 Disorder of kidney and ureter, unspecified: Secondary | ICD-10-CM

## 2022-10-15 DIAGNOSIS — Z789 Other specified health status: Secondary | ICD-10-CM | POA: Diagnosis not present

## 2022-10-15 DIAGNOSIS — C801 Malignant (primary) neoplasm, unspecified: Secondary | ICD-10-CM

## 2022-10-15 DIAGNOSIS — R072 Precordial pain: Secondary | ICD-10-CM

## 2022-10-15 DIAGNOSIS — I10 Essential (primary) hypertension: Secondary | ICD-10-CM | POA: Diagnosis not present

## 2022-10-15 DIAGNOSIS — E7800 Pure hypercholesterolemia, unspecified: Secondary | ICD-10-CM

## 2022-10-15 HISTORY — DX: Disorder of kidney and ureter, unspecified: N28.9

## 2022-10-15 HISTORY — DX: Malignant (primary) neoplasm, unspecified: C80.1

## 2022-10-15 MED ORDER — METOPROLOL TARTRATE 100 MG PO TABS: 100.0000 mg | ORAL_TABLET | Freq: Once | ORAL | 0 refills | Status: DC

## 2022-10-15 NOTE — Patient Instructions (Signed)
Medication Instructions:  Your physician recommends that you continue on your current medications as directed. Please refer to the Current Medication list given to you today.  *If you need a refill on your cardiac medications before your next appointment, please call your pharmacy*   Lab Work: Your physician recommends that you have a CBC and BMP today in the office.  If you have labs (blood work) drawn today and your tests are completely normal, you will receive your results only by: MyChart Message (if you have MyChart) OR A paper copy in the mail If you have any lab test that is abnormal or we need to change your treatment, we will call you to review the results.   Testing/Procedures:   Your cardiac CT will be scheduled at one of the below locations:   [facility] [address] [place], [place] [address] [phone]  If scheduled at [facility], please arrive at the [facility] and Children's Entrance (Entrance C2) of [facility] 30 minutes prior to test start time. You can use the FREE valet parking offered at entrance C (encouraged to control the heart rate for the test)  Proceed to the [facility] Radiology Department (first floor) to check-in and test prep.  All radiology patients and guests should use entrance C2 at [facility], accessed from [facility], even though the hospital's physical address listed is [address]     Please follow these instructions carefully (unless otherwise directed):  On the Night Before the Test: Be sure to Drink plenty of water. Do not consume any caffeinated/decaffeinated beverages or chocolate 12 hours prior to your test. Do not take any antihistamines 12 hours prior to your test.  On the Day of the Test: Drink plenty of water until 1 hour prior to the test. Do not eat any food 1 hour prior to test. You may take your regular medications prior to the test.  Take metoprolol  (Lopressor) two hours prior to test. This is a one time dose. FEMALES- please wear underwire-free bra if available, avoid dresses & tight clothing      After the Test: Drink plenty of water. After receiving IV contrast, you may experience a mild flushed feeling. This is normal. On occasion, you may experience a mild rash up to 24 hours after the test. This is not dangerous. If this occurs, you can take Benadryl 25 mg and increase your fluid intake. If you experience trouble breathing, this can be serious. If it is severe call 911 IMMEDIATELY. If it is mild, please call our office. If you take any of these medications: Glipizide/Metformin, Avandament, Glucavance, please do not take 48 hours after completing test unless otherwise instructed.  We will call to schedule your test 2-4 weeks out understanding that some insurance companies will need an authorization prior to the service being performed.   For non-scheduling related questions, please contact the cardiac imaging nurse navigator should you have any questions/concerns: Rockwell Alexandria, Cardiac Imaging Nurse Navigator Larey Brick, Cardiac Imaging Nurse Navigator [facility] Direct Office Dial: [phone]   For scheduling needs, including cancellations and rescheduling, please call [place], [phone].   Your next appointment:   8 week(s)  The format for your next appointment:   In Person  Provider:   Norman Herrlich, MD or Wallis Bamberg, NP Rosalita Levan)   Other Instructions Cardiac CT Angiogram A cardiac CT angiogram is a procedure to look at the heart and the  area around the heart. It may be done to help find the cause of chest pains or other symptoms of heart disease. During this procedure, a substance called contrast dye is injected into the blood vessels in the area to be checked. A large X-ray machine, called a CT scanner, then takes detailed pictures of the heart and the surrounding area. The  procedure is also sometimes called a coronary CT angiogram, coronary artery scanning, or CTA. A cardiac CT angiogram allows the health care provider to see how well blood is flowing to and from the heart. The health care provider will be able to see if there are any problems, such as: Blockage or narrowing of the coronary arteries in the heart. Fluid around the heart. Signs of weakness or disease in the muscles, valves, and tissues of the heart. Tell a health care provider about: Any allergies you have. This is especially important if you have had a previous allergic reaction to contrast dye. All medicines you are taking, including vitamins, herbs, eye drops, creams, and over-the-counter medicines. Any blood disorders you have. Any surgeries you have had. Any medical conditions you have. Whether you are pregnant or may be pregnant. Any anxiety disorders, chronic pain, or other conditions you have that may increase your stress or prevent you from lying still. What are the risks? Generally, this is a safe procedure. However, problems may occur, including: Bleeding. Infection. Allergic reactions to medicines or dyes. Damage to other structures or organs. Kidney damage from the contrast dye that is used. Increased risk of cancer from radiation exposure. This risk is low. Talk with your health care provider about: The risks and benefits of testing. How you can receive the lowest dose of radiation. What happens before the procedure? Wear comfortable clothing and remove any jewelry, glasses, dentures, and hearing aids. Follow instructions from your health care provider about eating and drinking. This may include: For 12 hours before the procedure -- avoid caffeine. This includes tea, coffee, soda, energy drinks, and diet pills. Drink plenty of water or other fluids that do not have caffeine in them. Being well hydrated can prevent complications. For 4-6 hours before the procedure -- stop eating  and drinking. The contrast dye can cause nausea, but this is less likely if your stomach is empty. Ask your health care provider about changing or stopping your regular medicines. This is especially important if you are taking diabetes medicines, blood thinners, or medicines to treat problems with erections (erectile dysfunction). What happens during the procedure?  Hair on your chest may need to be removed so that small sticky patches called electrodes can be placed on your chest. These will transmit information that helps to monitor your heart during the procedure. An IV will be inserted into one of your veins. You might be given a medicine to control your heart rate during the procedure. This will help to ensure that good images are obtained. You will be asked to lie on an exam table. This table will slide in and out of the CT machine during the procedure. Contrast dye will be injected into the IV. You might feel warm, or you may get a metallic taste in your mouth. You will be given a medicine called nitroglycerin. This will relax or dilate the arteries in your heart. The table that you are lying on will move into the CT machine tunnel for the scan. The person running the machine will give you instructions while the scans are being done. You may  be asked to: Keep your arms above your head. Hold your breath. Stay very still, even if the table is moving. When the scanning is complete, you will be moved out of the machine. The IV will be removed. The procedure may vary among health care providers and hospitals. What can I expect after the procedure? After your procedure, it is common to have: A metallic taste in your mouth from the contrast dye. A feeling of warmth. A headache from the nitroglycerin. Follow these instructions at home: Take over-the-counter and prescription medicines only as told by your health care provider. If you are told, drink enough fluid to keep your urine pale yellow.  This will help to flush the contrast dye out of your body. Most people can return to their normal activities right after the procedure. Ask your health care provider what activities are safe for you. It is up to you to get the results of your procedure. Ask your health care provider, or the department that is doing the procedure, when your results will be ready. Keep all follow-up visits as told by your health care provider. This is important. Contact a health care provider if: You have any symptoms of allergy to the contrast dye. These include: Shortness of breath. Rash or hives. A racing heartbeat. Summary A cardiac CT angiogram is a procedure to look at the heart and the area around the heart. It may be done to help find the cause of chest pains or other symptoms of heart disease. During this procedure, a large X-ray machine, called a CT scanner, takes detailed pictures of the heart and the surrounding area after a contrast dye has been injected into blood vessels in the area. Ask your health care provider about changing or stopping your regular medicines before the procedure. This is especially important if you are taking diabetes medicines, blood thinners, or medicines to treat erectile dysfunction. If you are told, drink enough fluid to keep your urine pale yellow. This will help to flush the contrast dye out of your body. This information is not intended to replace advice given to you by your health care provider. Make sure you discuss any questions you have with your health care provider. Document Revised: 01/18/2019 Document Reviewed: 01/18/2019 Elsevier Patient Education  2020 [employer].

## 2022-10-16 LAB — CBC
Hematocrit: 39.9 % (ref 34.0–46.6)
Hemoglobin: 13.3 g/dL (ref 11.1–15.9)
MCH: 29.1 pg (ref 26.6–33.0)
MCHC: 33.3 g/dL (ref 31.5–35.7)
MCV: 87 fL (ref 79–97)
Platelets: 189 x10E3/uL (ref 150–450)
RBC: 4.57 x10E6/uL (ref 3.77–5.28)
RDW: 13 % (ref 11.7–15.4)
WBC: 5.9 x10E3/uL (ref 3.4–10.8)

## 2022-10-16 LAB — BASIC METABOLIC PANEL WITH GFR
BUN/Creatinine Ratio: 14 (ref 12–28)
BUN: 11 mg/dL (ref 8–27)
CO2: 26 mmol/L (ref 20–29)
Calcium: 9.4 mg/dL (ref 8.7–10.3)
Chloride: 99 mmol/L (ref 96–106)
Creatinine, Ser: 0.79 mg/dL (ref 0.57–1.00)
Glucose: 111 mg/dL — ABNORMAL HIGH (ref 70–99)
Potassium: 3.7 mmol/L (ref 3.5–5.2)
Sodium: 139 mmol/L (ref 134–144)
eGFR: 77 mL/min/1.73

## 2022-10-21 ENCOUNTER — Telehealth (HOSPITAL_COMMUNITY): Payer: Self-pay | Admitting: Emergency Medicine

## 2022-10-21 NOTE — Telephone Encounter (Signed)
Reaching out to patient to offer assistance regarding upcoming cardiac imaging study; pt verbalizes understanding of appt date/time, parking situation and where to check in, pre-test NPO status and medications ordered, and verified current allergies; name and call back number provided for further questions should they arise Chelsye Suhre RN Navigator Cardiac Imaging [facility] [phone] office [phone] cell 

## 2022-10-22 ENCOUNTER — Ambulatory Visit (HOSPITAL_COMMUNITY)
Admission: RE | Admit: 2022-10-22 | Discharge: 2022-10-22 | Disposition: A | Discharge status: Home / Self Care | Payer: Medicare PPO | Source: Ambulatory Visit | Attending: Cardiology | Admitting: Cardiology

## 2022-10-22 DIAGNOSIS — R072 Precordial pain: Secondary | ICD-10-CM | POA: Insufficient documentation

## 2022-10-22 MED ORDER — METOPROLOL TARTRATE 5 MG/5ML IV SOLN
10.0000 mg | Freq: Once | INTRAVENOUS | Status: AC
  Administered 2022-10-22: 10 mg via INTRAVENOUS

## 2022-10-22 MED ORDER — NITROGLYCERIN 0.4 MG SL SUBL
0.8000 mg | SUBLINGUAL_TABLET | Freq: Once | SUBLINGUAL | Status: AC
  Administered 2022-10-22: 0.8 mg via SUBLINGUAL

## 2022-10-22 MED ORDER — NITROGLYCERIN 0.4 MG SL SUBL
SUBLINGUAL_TABLET | SUBLINGUAL | Status: AC
  Filled 2022-10-22: qty 2

## 2022-10-22 MED ORDER — IOHEXOL 350 MG/ML SOLN
95.0000 mL | Freq: Once | INTRAVENOUS | Status: AC | PRN
  Administered 2022-10-22: 95 mL via INTRAVENOUS

## 2022-10-22 MED ORDER — METOPROLOL TARTRATE 5 MG/5ML IV SOLN
INTRAVENOUS | Status: AC
  Filled 2022-10-22: qty 10

## 2022-11-10 DIAGNOSIS — E041 Nontoxic single thyroid nodule: Secondary | ICD-10-CM | POA: Diagnosis not present

## 2022-12-07 NOTE — Progress Notes (Signed)
Cardiology Office Note:    Date:  10/15/2022   ID:  Courtany, Haider January 14, 1946, MRN [id number]  PCP:  Marylen Ponto, MD   [facility] Providers Cardiologist:  None     Referring MD: Marylen Ponto, MD    History of Present Illness:    Natalie Mack is a 77 y.o. female with a hx of hypertension, breast cancer 2016, palpitations, murmur, hyperlipidemia with statin intolerance.  In November 2022 she wore a monitor which revealed an average heart rate of 74 bpm, predominant underlying rhythm was sinus, first-degree AV block was present, 28 runs of SVT were noted, some episodes could have been atrial tachycardia with variable block.  Most recently evaluated in the office by Dr. Dulce Sellar on 01/09/2022 at this time she was doing well from a cardiac perspective.  Lipid lowering medications were discussed again however she ultimately declined.   She called our triage line on 09/14/2022 complaining of chest pain, she was instructed to follow-up with the ED.  She presented to [facility] on 09/16/2022 with a 6-week history of atypical chest pain. She had an MPI at [facility] on 09/16/2022, she had a hypotensive reaction, systolic blood pressure felt to the less than 80 systolic with transient 2-1 AV block.  She had apparently been given hydralazine prior to the study, had a vagal episode, she quickly recovered with a fluid challenge and elevation of her legs.  Her MPI was negative for ischemia.  She had an echocardiogram on 09/16/2022 at [facility], EF 60 to 65%, LV restrictive pattern was observed, no valvular abnormalities, RV systolic pressure could not be estimated.    Evaluated in office posthospitalization, because she continued to endorse chest pain that was not anginal in nature however had mixed features which was causing her a significant amount of anxiety.  We arrange for coronary CTA which revealed a calcium score of 1.83 placing her in the 19th percentile.  She  presents today for follow-up of her hypertension and hyperlipidemia.  She states she has been doing well from a cardiac perspective, she has had an episode of chest pain that sounds like it was GI in nature as it was relieved with increasing her PPI medication.  She has intermittent pedal edema that is typically relieved with elevation.  We did discuss managing her hyperlipidemia however she is not interested in any lipid lowering medications. She denies chest pain, palpitations, dyspnea, pnd, orthopnea, n, v, dizziness, syncope, edema, weight gain, or early satiety.   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 Swallow whole.   Calcium 600-200 MG-UNIT tablet Take 1 tablet by mouth daily.  carvedilol (COREG) 12.5 MG tablet Take 12.5 mg by mouth in the morning and at bedtime.    Cholecalciferol (VITAMIN D3) 50 MCG (2000 UT) CAPS Take 2,000 Units by mouth daily.   ezetimibe (ZETIA) 10 MG tablet Take 10 mg by mouth daily.   famotidine (PEPCID) 20 MG tablet Take 40 mg by mouth at bedtime.   Lactobacillus (PROBIOTIC ACIDOPHILUS PO) Take 1 capsule by mouth daily.   mupirocin ointment (BACTROBAN) 2 % Apply 1 Application topically daily.   nitroGLYCERIN (NITROSTAT)  0.4 MG SL tablet Place 1 tablet (0.4 mg total) under the tongue every 5 (five) minutes as needed for chest pain.   pantoprazole (PROTONIX) 40 MG tablet Take 40 mg by mouth 2 (two) times daily.   telmisartan (MICARDIS) 80 MG tablet TAKE 1 TABLET BY MOUTH EVERY DAY     Allergies:   Ciprofloxacin, Nitrofuran derivatives, Sulfa antibiotics, Potassium chloride, Hydrochlorothiazide, Oxycodone, and Valsartan   Social History   Socioeconomic History   Marital status: Married    Spouse name: Not on file   Number of children: Not on file   Years of education: Not on file   Highest education level: Not on file  Occupational History   Not on file  Tobacco Use   Smoking status: Never   Smokeless tobacco: Never  Vaping Use   Vaping Use: Never used  Substance and Sexual Activity   Alcohol use: No   Drug use: No   Sexual activity: Not on file  Other Topics Concern   Not on file  Social History Narrative   Not on file   Social Determinants of Health   Financial Resource Strain: Not on file  Food Insecurity: Not on file  Transportation Needs: Not on file  Physical Activity: Not on file  Stress: Not on file  Social Connections: Not on file     Family History: The patient's family history includes Heart attack in her maternal grandmother and mother; Heart failure in her mother; Hypertension in her father.  ROS:   Please see the history of present illness.     All other systems reviewed and are negative.  EKGs/Labs/Other Studies Reviewed:    The following studies were reviewed today: Cardiac Studies & Procedures     STRESS TESTS  MYOCARDIAL PERFUSION IMAGING 10/06/2022     MONITORS  LONG TERM MONITOR (3-14 DAYS) 04/25/2021  Narrative Patch Wear Time:  5 days and 21 hours (2022-10-27T09:55:50-0400 to 2022-11-02T07:03:28-0400)  Patient had a min HR of 59 bpm, max HR of 179 bpm, and avg HR of 74 bpm. Predominant underlying rhythm was Sinus Rhythm. First Degree AV Block was  present. 28 Supraventricular Tachycardia runs occurred, the run with the fastest interval lasting 11 beats with a max rate of 179 bpm, the longest lasting 15 beats with an avg rate of 100 bpm. Some episodes of Supraventricular Tachycardia may be possible Atrial Tachycardia with variable block. Isolated SVEs were rare (<1.0%), SVE Couplets were rare (<1.0%), and SVE Triplets were rare (<1.0%). Isolated VEs were rare (<1.0%), VE Couplets were rare (<1.0%), and no VE Triplets were present.  There were no triggered or diary events There are brief runs of atrial premature contractions longest 15 complexes rate of 100 bpm atrial tachycardia There are no episodes of atrial fibrillation or flutter Both supraventricular and ventricular ectopy were rare.  Patient was called with the preliminary report at her request yesterday            EKG:  EKG is  ordered today.  The ekg ordered today demonstrates SR with 1st degree AV block, HR 66 bpm, consistent with prior EKG tracing.   Recent Labs: 01/09/2022: BUN 14; Creatinine, Ser 0.74; Potassium 4.0; Sodium 139  Recent Lipid Panel    Component Value Date/Time   CHOL 242 (H) 01/13/2021 1335   TRIG 166 (H) 01/13/2021 1335   HDL 53 01/13/2021 1335   CHOLHDL 4.6 (H) 01/13/2021 1335   LDLCALC 159 (H) 01/13/2021 1335     Risk Assessment/Calculations:      HYPERTENSION CONTROL Vitals:   10/15/22 0753 10/15/22 0846  BP: (!) 160/110 (!) 158/88    The patient's blood pressure is elevated above target today.  In order to address the patient's elevated BP: Blood pressure will be monitored at home to determine if medication changes need to be made.            Physical Exam:    VS:  BP (!) 158/88   Pulse 66   Ht 5\' 7"  (1.702 m)   Wt 146 lb (66.2 kg)   SpO2 97%   BMI 22.87 kg/m     Wt Readings from Last 3 Encounters:  10/15/22 146 lb (66.2 kg)  07/23/22 152 lb 9.6 oz (69.2 kg)  01/09/22 146 lb (66.2 kg)     GEN:  Well nourished, well  developed in no acute distress HEENT: Normal NECK: No JVD; No carotid bruits LYMPHATICS: No lymphadenopathy CARDIAC: RRR, no murmurs, rubs, gallops RESPIRATORY:  Clear to auscultation without rales, wheezing or rhonchi  ABDOMEN: Soft, non-tender, non-distended MUSCULOSKELETAL:  No edema; No deformity  SKIN: Warm and dry NEUROLOGIC:  Alert and oriented x 3 PSYCHIATRIC:  Normal affect   ASSESSMENT:    1. Precordial pain   2. Essential hypertension   3. Pure hypercholesterolemia   4. Statin intolerance   5. Hyponatremia   6. Palpitations    PLAN:    In order of problems listed above:  CAD -coronary CTA revealed a calcium score of 1.83, mild nonobstructive CAD, continue aspirin 81 mg daily, continue Coreg 12.5 mg twice daily, continue nitroglycerin as needed--has not needed, Stable with no anginal symptoms. No indication for ischemic evaluation.   Hypertension -blood pressure is well-controlled today at 130/80, continue telmisartan 80 mg daily, continue Coreg 12.5 mg twice daily. Hyperlipidemia intolerant to statins -most recent LDL on file is elevated at 159.  She tried Zetia, but was unable to tolerate. She is not interested in PCSK9i therapy.  Palpitations - quiescent.   Disposition-return in 1 year.           Medication Adjustments/Labs and Tests Ordered: Current medicines are reviewed at length with the patient today.  Concerns regarding medicines are outlined above.  Orders Placed This Encounter  Procedures   CT CORONARY MORPH W/CTA COR W/SCORE W/CA W/CM &/OR WO/CM   EKG 12-Lead   No orders of the defined types were placed in this encounter.   There are no Patient Instructions on file for this visit.   Signed, Flossie Dibble, NP  10/15/2022 8:49 AM    [facility]

## 2022-12-11 ENCOUNTER — Encounter: Payer: Self-pay | Admitting: Cardiology

## 2022-12-11 ENCOUNTER — Ambulatory Visit: Payer: Medicare PPO | Attending: Cardiology | Admitting: Cardiology

## 2022-12-11 VITALS — BP 130/81 | HR 99 | Ht 67.0 in | Wt 144.4 lb

## 2022-12-11 DIAGNOSIS — E782 Mixed hyperlipidemia: Secondary | ICD-10-CM | POA: Diagnosis not present

## 2022-12-11 DIAGNOSIS — I10 Essential (primary) hypertension: Secondary | ICD-10-CM | POA: Diagnosis not present

## 2022-12-11 DIAGNOSIS — R002 Palpitations: Secondary | ICD-10-CM | POA: Diagnosis not present

## 2022-12-11 DIAGNOSIS — Z789 Other specified health status: Secondary | ICD-10-CM

## 2022-12-11 DIAGNOSIS — I2510 Atherosclerotic heart disease of native coronary artery without angina pectoris: Secondary | ICD-10-CM

## 2022-12-11 NOTE — Patient Instructions (Signed)
Medication Instructions:  Your physician recommends that you continue on your current medications as directed. Please refer to the Current Medication list given to you today.  *If you need a refill on your cardiac medications before your next appointment, please call your pharmacy*   Lab Work: NONE If you have labs (blood work) drawn today and your tests are completely normal, you will receive your results only by: MyChart Message (if you have MyChart) OR A paper copy in the mail If you have any lab test that is abnormal or we need to change your treatment, we will call you to review the results.   Testing/Procedures: NONE   Follow-Up: At [facility], you and your health needs are our priority.  As part of our continuing mission to provide you with exceptional heart care, we have created designated Provider Care Teams.  These Care Teams include your primary Cardiologist (physician) and Advanced Practice Providers (APPs -  Physician Assistants and Nurse Practitioners) who all work together to provide you with the care you need, when you need it.  We recommend signing up for the patient portal called "MyChart".  Sign up information is provided on this After Visit Summary.  MyChart is used to connect with patients for Virtual Visits (Telemedicine).  Patients are able to view lab/test results, encounter notes, upcoming appointments, etc.  Non-urgent messages can be sent to your provider as well.   To learn more about what you can do with MyChart, go to [email or web address].    Your next appointment:   1 year(s) Provider:   Brian Munley, MD   Other Instructions   

## 2023-01-18 ENCOUNTER — Other Ambulatory Visit: Payer: Self-pay | Admitting: Cardiology

## 2023-01-25 DIAGNOSIS — Z1231 Encounter for screening mammogram for malignant neoplasm of breast: Secondary | ICD-10-CM | POA: Diagnosis not present

## 2023-01-28 DIAGNOSIS — R350 Frequency of micturition: Secondary | ICD-10-CM | POA: Diagnosis not present

## 2023-01-28 DIAGNOSIS — R3915 Urgency of urination: Secondary | ICD-10-CM | POA: Diagnosis not present

## 2023-02-01 DIAGNOSIS — Z170 Estrogen receptor positive status [ER+]: Secondary | ICD-10-CM | POA: Diagnosis not present

## 2023-02-01 DIAGNOSIS — C50412 Malignant neoplasm of upper-outer quadrant of left female breast: Secondary | ICD-10-CM | POA: Diagnosis not present

## 2023-03-23 DIAGNOSIS — Z1331 Encounter for screening for depression: Secondary | ICD-10-CM | POA: Diagnosis not present

## 2023-03-23 DIAGNOSIS — E1165 Type 2 diabetes mellitus with hyperglycemia: Secondary | ICD-10-CM | POA: Diagnosis not present

## 2023-03-23 DIAGNOSIS — Z1339 Encounter for screening examination for other mental health and behavioral disorders: Secondary | ICD-10-CM | POA: Diagnosis not present

## 2023-03-23 DIAGNOSIS — Z0000 Encounter for general adult medical examination without abnormal findings: Secondary | ICD-10-CM | POA: Diagnosis not present

## 2023-03-23 DIAGNOSIS — Z79899 Other long term (current) drug therapy: Secondary | ICD-10-CM | POA: Diagnosis not present

## 2023-03-23 DIAGNOSIS — Z6823 Body mass index (BMI) 23.0-23.9, adult: Secondary | ICD-10-CM | POA: Diagnosis not present

## 2023-03-23 DIAGNOSIS — I10 Essential (primary) hypertension: Secondary | ICD-10-CM | POA: Diagnosis not present

## 2023-05-20 DIAGNOSIS — E1136 Type 2 diabetes mellitus with diabetic cataract: Secondary | ICD-10-CM | POA: Diagnosis not present

## 2023-05-20 DIAGNOSIS — E1122 Type 2 diabetes mellitus with diabetic chronic kidney disease: Secondary | ICD-10-CM | POA: Diagnosis not present

## 2023-05-20 DIAGNOSIS — I209 Angina pectoris, unspecified: Secondary | ICD-10-CM | POA: Diagnosis not present

## 2023-05-20 DIAGNOSIS — K219 Gastro-esophageal reflux disease without esophagitis: Secondary | ICD-10-CM | POA: Diagnosis not present

## 2023-05-20 DIAGNOSIS — M810 Age-related osteoporosis without current pathological fracture: Secondary | ICD-10-CM | POA: Diagnosis not present

## 2023-05-20 DIAGNOSIS — R32 Unspecified urinary incontinence: Secondary | ICD-10-CM | POA: Diagnosis not present

## 2023-05-20 DIAGNOSIS — K5900 Constipation, unspecified: Secondary | ICD-10-CM | POA: Diagnosis not present

## 2023-05-20 DIAGNOSIS — E785 Hyperlipidemia, unspecified: Secondary | ICD-10-CM | POA: Diagnosis not present

## 2023-05-20 DIAGNOSIS — N182 Chronic kidney disease, stage 2 (mild): Secondary | ICD-10-CM | POA: Diagnosis not present

## 2023-07-06 DIAGNOSIS — E1165 Type 2 diabetes mellitus with hyperglycemia: Secondary | ICD-10-CM | POA: Diagnosis not present

## 2023-07-06 DIAGNOSIS — E782 Mixed hyperlipidemia: Secondary | ICD-10-CM | POA: Diagnosis not present

## 2023-07-27 ENCOUNTER — Encounter: Payer: Self-pay | Admitting: Hematology and Oncology

## 2023-07-27 ENCOUNTER — Telehealth: Payer: Self-pay | Admitting: Hematology and Oncology

## 2023-07-27 ENCOUNTER — Inpatient Hospital Stay: Payer: Medicare PPO | Attending: Oncology | Admitting: Hematology and Oncology

## 2023-07-27 VITALS — BP 175/93 | HR 70 | Temp 98.0°F | Resp 20 | Ht 67.0 in | Wt 146.6 lb

## 2023-07-27 DIAGNOSIS — M2550 Pain in unspecified joint: Secondary | ICD-10-CM | POA: Insufficient documentation

## 2023-07-27 DIAGNOSIS — I10 Essential (primary) hypertension: Secondary | ICD-10-CM | POA: Diagnosis not present

## 2023-07-27 DIAGNOSIS — Z885 Allergy status to narcotic agent status: Secondary | ICD-10-CM | POA: Diagnosis not present

## 2023-07-27 DIAGNOSIS — Z882 Allergy status to sulfonamides status: Secondary | ICD-10-CM | POA: Insufficient documentation

## 2023-07-27 DIAGNOSIS — Z803 Family history of malignant neoplasm of breast: Secondary | ICD-10-CM | POA: Diagnosis not present

## 2023-07-27 DIAGNOSIS — R232 Flushing: Secondary | ICD-10-CM | POA: Diagnosis not present

## 2023-07-27 DIAGNOSIS — E042 Nontoxic multinodular goiter: Secondary | ICD-10-CM | POA: Insufficient documentation

## 2023-07-27 DIAGNOSIS — Z9049 Acquired absence of other specified parts of digestive tract: Secondary | ICD-10-CM | POA: Insufficient documentation

## 2023-07-27 DIAGNOSIS — M85859 Other specified disorders of bone density and structure, unspecified thigh: Secondary | ICD-10-CM | POA: Diagnosis not present

## 2023-07-27 DIAGNOSIS — R079 Chest pain, unspecified: Secondary | ICD-10-CM | POA: Insufficient documentation

## 2023-07-27 DIAGNOSIS — I700 Atherosclerosis of aorta: Secondary | ICD-10-CM | POA: Diagnosis not present

## 2023-07-27 DIAGNOSIS — Z90710 Acquired absence of both cervix and uterus: Secondary | ICD-10-CM | POA: Insufficient documentation

## 2023-07-27 DIAGNOSIS — Z881 Allergy status to other antibiotic agents status: Secondary | ICD-10-CM | POA: Insufficient documentation

## 2023-07-27 DIAGNOSIS — C50412 Malignant neoplasm of upper-outer quadrant of left female breast: Secondary | ICD-10-CM | POA: Diagnosis not present

## 2023-07-27 DIAGNOSIS — Z79899 Other long term (current) drug therapy: Secondary | ICD-10-CM | POA: Insufficient documentation

## 2023-07-27 DIAGNOSIS — Z77090 Contact with and (suspected) exposure to asbestos: Secondary | ICD-10-CM

## 2023-07-27 DIAGNOSIS — R519 Headache, unspecified: Secondary | ICD-10-CM | POA: Diagnosis not present

## 2023-07-27 DIAGNOSIS — Z170 Estrogen receptor positive status [ER+]: Secondary | ICD-10-CM | POA: Insufficient documentation

## 2023-07-27 DIAGNOSIS — Z79811 Long term (current) use of aromatase inhibitors: Secondary | ICD-10-CM | POA: Insufficient documentation

## 2023-07-27 DIAGNOSIS — R911 Solitary pulmonary nodule: Secondary | ICD-10-CM

## 2023-07-27 DIAGNOSIS — E785 Hyperlipidemia, unspecified: Secondary | ICD-10-CM | POA: Diagnosis not present

## 2023-07-27 DIAGNOSIS — Z853 Personal history of malignant neoplasm of breast: Secondary | ICD-10-CM

## 2023-07-27 NOTE — Progress Notes (Signed)
 [facility] [address] [place],  [place]  [address] [phone]      PATIENT CARE TEAM: Patient Care Team: Natalie Ponto, MD as PCP - General (Family Medicine) Natalie Sellar Iline Oven, MD as PCP - Cardiology (Cardiology) Natalie Mack., MD as Referring Physician (Surgery)  DATE OF VISIT: 07/27/23   CLINIC:  Long Term Survivorship   REASON FOR VISIT:  Routine follow-up for history of breast cancer   BRIEF ONCOLOGIC HISTORY:  Natalie Mack is a 78 y.o. 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  She was treated with lumpectomy.  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  2 sentinel nodes were negative for metastasis.  A medial margin was positive, so she underwent re-excision, which did not reveal any residual cancer.  Estrogen and progesterone receptors were positive and HER 2/Neu negative.  Ki 67 was 5%.  We discussed the option of Oncotype DX, but she felt she would not be interested in chemotherapy regardless, so this was not pursued.  She received adjuvant radiation to the left breast completed in November 2016.  Bone density scan in July 2016 was normal.  She was placed on anastrozole 1 mg daily in December 2016.  She had difficulty tolerating anastrozole 1 mg daily due to arthralgias, vaginal pressure and non refreshing sleep.  She discontinued anastrozole after about 2 months, then later started taking half a pill daily, which she tolerated fairly well.  Repeat bone density scan in August 2018 remained normal, with just slight worsening since 2008.  Bilateral diagnostic mammogram in August 2020 did not reveal any evidence of malignancy. She completed 5 years of anastrozole at 50% dose in December 2021.  Annual bilateral screening mammograms have remained without evidence of  malignancy.  Bone density scan at her PCP office in 2023 apparently revealed osteopenia hip.  Oncology History  Breast cancer of upper-outer quadrant of left female breast (HCC)  01/22/2015 Cancer Staging   Staging form: Breast, AJCC 7th Edition - Clinical stage from 01/22/2015: Stage IA (T1c, N0, M0) - Signed by Dellia Beckwith, MD on 07/31/2021 Staged by: Managing physician Diagnostic confirmation: Positive histology Specimen type: Excision Histopathologic type: Infiltrating duct carcinoma, NOS Stage prefix: Initial diagnosis Laterality: Left Tumor size (mm): 17 Method of lymph node assessment: Sentinel lymph node biopsy Histologic grade (G): G2 Lymph-vascular invasion (LVI): LVI not present (absent)/not identified Residual tumor (R): R0 - None Paget's disease: Negative Tumor grade (Scarff-Bloom-Richardson system): G2 Estrogen receptor status: Positive Progesterone receptor status: Positive HER2 status: Negative IHC of regional lymph nodes: Not assessed Multi-gene signature risk of recurrence: Not assessed Prognostic indicators: Took anastrazole x 5 years but only at 50% dose Stage used in treatment planning: Yes National guidelines used in treatment planning: Yes Type of national guideline used in treatment planning: NCCN   08/30/2015 Initial Diagnosis   Breast cancer of upper-outer quadrant of left female breast [facility])     INTERVAL HISTORY:  [name] presents to the Survivorship [facility] today for routine follow-up for her history of breast cancer.  Overall, she reports feeling quite well. She denies any changes in her family history, although a paternal aunt with breast cancer in her 82's and this is not documented in Epic. She sees a Armed forces operational officer annually.    The patient has had multiple issues in the  past year. She underwent EGD September 10, 2022 for mid chest discomfort despite, which was negative. She continues pantoprazole daily and is also on famotidine daily. She was  admitted overnight April 10-11 at [facility] for evaluation of chest pain. EKG revealed sinus rhythm with 1st degree AV block with possible infarct.  Troponins were normal echocardiogram revealed normal sinus rhythm with normal left ventricular size and function with an ejection fraction of 60 to 65%, although study quality was technically difficult.  Cardiology recommended nuclear stress test which was normal. CTA chest revealed a 5 mm right upper lobe nodule, as well as multiple left thyroid nodules. Follow-up of the right upper lobe nodule was recommended if the patient is at high risk.  This has not been scheduled.   She never smoked. 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  She is status post partial right thyroidectomy for benign thyroid nodules.  Thyroid ultrasound in May revealed 2 thyroid nodules the largest appearing mostly solid, measuring 2.8 cm, the second measuring 1.4 x 2.4 cm, was felt to be benign cyst. Biopsy of the solid thyroid nodule revealed benign follicular nodule. She is status post MOHS surgery for basal cell of nose in March 2023. She also had a basal cell removed from the posterior right shoulder.  She states that she had a wellness check in October.  She states she is taking vitamin D 2000 international units, but she is not not calcium.  She does not have follow-up imaging scheduled.   REVIEW OF SYSTEMS:  Review of Systems  Constitutional:  Negative for appetite change, chills, diaphoresis, fatigue, fever and unexpected weight change.  HENT:   Negative for lump/mass, mouth sores, nosebleeds and sore throat.   Respiratory:  Negative for chest tightness, cough, hemoptysis and shortness of breath.   Cardiovascular:  Negative for chest pain, leg swelling and palpitations.  Gastrointestinal:  Negative for abdominal pain, blood in stool, constipation, diarrhea, nausea and vomiting.  Endocrine: Positive for hot flashes (mild,  occasional).  Genitourinary:  Negative for difficulty urinating, dysuria, frequency, hematuria, vaginal bleeding and vaginal discharge.   Musculoskeletal:  Positive for arthralgias (mild, generalized). Negative for back pain, gait problem, myalgias and neck pain.  Skin:  Negative for rash.  Neurological:  Positive for headaches (occasional). Negative for dizziness, extremity weakness, gait problem, light-headedness and numbness.  Hematological:  Negative for adenopathy. Does not bruise/bleed easily.  Psychiatric/Behavioral:  Negative for depression and sleep disturbance. The patient is not nervous/anxious.      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   Breast cancer (HCC)    Breast cancer of upper-outer quadrant of left female breast (HCC) 08/30/2015   Essential hypertension 09/19/2016   Estrogen receptor positive neoplasm 08/30/2015   Hyperlipidemia 02/01/2017   Hypokalemia 02/01/2017   Murmur 01/27/2017   Palpitation 09/19/2016   Overview:  Overview:  Rapid HR, not documented   Statin intolerance 02/01/2017    Past Surgical History:  Procedure Laterality Date   ABDOMINAL HYSTERECTOMY     APPENDECTOMY     BREAST LUMPECTOMY Left 01/30/2015   CESAREAN SECTION     CHOLECYSTECTOMY     MOHS SURGERY  02/2017   THYROID SURGERY     TONSILLECTOMY       ALLERGIES:  Allergies  Allergen Reactions   Ciprofloxacin Other (See Comments)    Tongue swelling and  mouth tingling   Nitrofuran Derivatives Swelling   Sulfa Antibiotics Hives   Potassium Chloride Other (See Comments)    Cramps and stomach pain   Hydralazine Nausea And Vomiting   Hydrochlorothiazide Other (See Comments)   Oxycodone Other (See Comments)    Unknown   Valsartan Palpitations    Pt reports it made my heart race     CURRENT MEDICATIONS:  Outpatient Encounter Medications as of 07/27/2023   Medication Sig   aspirin EC 81 MG tablet Take 1 tablet (81 mg total) by mouth daily. Swallow whole. (Patient taking differently: Take 81 mg by mouth daily. Taking every other dayd)   Calcium 600-200 MG-UNIT tablet Take 1 tablet by mouth daily. (Patient not taking: Reported on 07/27/2023)   carvedilol (COREG) 12.5 MG tablet Take 12.5 mg by mouth in the morning and at bedtime.    Cholecalciferol (VITAMIN D3) 50 MCG (2000 UT) CAPS Take 2,000 Units by mouth daily.   famotidine (PEPCID) 20 MG tablet Take 40 mg by mouth at bedtime.   nitroGLYCERIN (NITROSTAT) 0.4 MG SL tablet Place 1 tablet (0.4 mg total) under the tongue every 5 (five) minutes as needed for chest pain.   pantoprazole (PROTONIX) 40 MG tablet Take 40 mg by mouth 2 (two) times daily. Only taking one in the morning,   telmisartan (MICARDIS) 80 MG tablet TAKE 1 TABLET BY MOUTH EVERY DAY   trimethoprim (TRIMPEX) 100 MG tablet Take 100 mg by mouth at bedtime. (Patient not taking: Reported on 07/27/2023)   No facility-administered encounter medications on file as of 07/27/2023.     ONCOLOGIC FAMILY HISTORY:  Family History  Problem Relation Age of Onset   Heart failure Mother    Heart attack Mother    Hypertension Father    Breast cancer Paternal Aunt    Heart attack Maternal Grandmother      SOCIAL HISTORY:   reports that she has never smoked. She has never used smokeless tobacco. She reports that she does not drink alcohol and does not use drugs.  Roselynne Lacretia Nicks Goold is married and lives with her spouse Joshua, [place].  Ms. Biller  has 3 adult children.  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  She denies any current or history of tobacco, alcohol, or illicit drug use.    PHYSICAL EXAMINATION:  Vital Signs: Vitals:   07/27/23 0916 07/27/23 0923  BP: (!) 187/92 (!) 175/93  Pulse: 70   Resp: 20   Temp: 98 F (36.7 C)   SpO2: 100%    Filed Weights   07/27/23 0916  Weight: 146 lb 9.6 oz (66.5 kg)    Physical Exam Vitals and nursing note reviewed.  Constitutional:      General: She is not in acute distress.    Appearance: Normal appearance.  HENT:     Head: Normocephalic and atraumatic.     Mouth/Throat:     Mouth: Mucous membranes are moist.     Pharynx: Oropharynx is clear. No oropharyngeal exudate or posterior oropharyngeal erythema.  Eyes:     General: No scleral icterus.    Extraocular Movements: Extraocular movements intact.     Conjunctiva/sclera: Conjunctivae normal.     Pupils: Pupils are equal, round, and reactive to light.  Cardiovascular:     Rate and Rhythm: Normal rate and regular rhythm.     Heart sounds: Normal heart sounds. No murmur heard.    No friction rub. No gallop.  Pulmonary:  Effort: Pulmonary effort is normal.     Breath sounds: Normal breath sounds. No wheezing, rhonchi or rales.  Chest:  Breasts:    Right: Normal. No swelling, bleeding, inverted nipple, mass, nipple discharge, skin change or tenderness.     Left: Normal. No swelling, bleeding, inverted nipple, mass, nipple discharge, skin change or tenderness.     Comments: Stable post surgical changes in the left breast lower outer quadrant Abdominal:     General: There is no distension.     Palpations: Abdomen is soft. There is no hepatomegaly, splenomegaly or mass.     Tenderness: There is no abdominal tenderness.  Musculoskeletal:        General: Normal range of motion.     Cervical back: Normal range of motion and neck supple. No tenderness.     Right lower leg: No edema.     Left lower leg: No edema.  Lymphadenopathy:     Cervical: No cervical adenopathy.     Upper Body:     Right upper body: No supraclavicular or axillary adenopathy.     Left upper body: No supraclavicular or axillary adenopathy.     Lower Body: No right inguinal adenopathy. No left inguinal adenopathy.  Skin:    General: Skin is warm and dry.     Coloration: Skin is not jaundiced.     Findings: No rash.   Neurological:     Mental Status: She is alert and oriented to person, place, and time.     Cranial Nerves: No cranial nerve deficit.  Psychiatric:        Mood and Affect: Mood normal.        Behavior: Behavior normal.        Thought Content: Thought content normal.     LABORATORY DATA:     Latest Ref Rng & Units 10/15/2022    9:13 AM 10/23/2017    6:57 PM 12/26/2009    8:00 AM  CBC  WBC 3.4 - 10.8 x10E3/uL 5.9  4.9    Hemoglobin 11.1 - 15.9 g/dL 11.9  14.7  82.9   Hematocrit 34.0 - 46.6 % 39.9  41.0    Platelets 150 - 450 x10E3/uL 189  195        Latest Ref Rng & Units 10/15/2022    9:13 AM 01/09/2022    9:00 AM 01/13/2021    1:35 PM  CMP  Glucose 70 - 99 mg/dL 562  130  95   BUN 8 - 27 mg/dL 11  14  10    Creatinine 0.57 - 1.00 mg/dL 8.65  7.84  6.96   Sodium 134 - 144 mmol/L 139  139  140   Potassium 3.5 - 5.2 mmol/L 3.7  4.0  3.9   Chloride 96 - 106 mmol/L 99  101  101   CO2 20 - 29 mmol/L 26  24  25    Calcium 8.7 - 10.3 mg/dL 9.4  9.3  9.3   Total Protein 6.0 - 8.5 g/dL   6.3   Total Bilirubin 0.0 - 1.2 mg/dL   0.6   Alkaline Phos 44 - 121 IU/L   89   AST 0 - 40 IU/L   15   ALT 0 - 32 IU/L   9     DIAGNOSTIC IMAGING:    Exam(s): [id number] MAM/MAM DIGITAL TOMO SCREENING B  CLINICAL DATA: Screening.  EXAM:  DIGITAL SCREENING BILATERAL MAMMOGRAM WITH TOMOSYNTHESIS AND CAD  TECHNIQUE:  Bilateral screening digital craniocaudal and mediolateral  oblique  mammograms were obtained. Bilateral screening digital breast  tomosynthesis was performed. The images were evaluated with  computer-aided detection.  COMPARISON: Previous exam(s).  ACR Breast Density Category c: The breasts are heterogeneously  dense, which may obscure small masses.  FINDINGS:  There are no findings suspicious for malignancy.  IMPRESSION:  No mammographic evidence of malignancy. A result letter of this  screening mammogram will be mailed directly to the patient.  RECOMMENDATION:  Screening mammogram  in one year. (Code:SM-B-01Y)  BI-RADS CATEGORY 1: Negative.     Exam(s): [id number] US/US SOFT TISSUE HEAD/NECK  CLINICAL DATA: Goiter. History of partial right hemithyroidectomy  25 years ago.  EXAM:  THYROID ULTRASOUND  TECHNIQUE:  Ultrasound examination of the thyroid gland and adjacent soft  tissues was performed.  COMPARISON: None Available.  FINDINGS:  Parenchymal Echotexture: Mildly heterogenous  Isthmus: 0.2 cm  Right lobe: Surgically absent  Left lobe: 5.8 x 1.7 x 2.1 cm  _________________________________________________________  Estimated total number of nodules >/= 1 cm: 2  Number of spongiform nodules >/= 2 cm not described below (TR1): 0  Number of mixed cystic and solid nodules >/= 1.5 cm not described  below (TR2): 0  _________________________________________________________  Nodule # 1:  Location: Left; Superior  Maximum size: 2.8 cm; Other 2 dimensions: 1.4 x 2.4 cm  Composition: solid/almost completely solid (2)  Echogenicity: isoechoic (1)  Shape: not taller-than-wide (0)  Margins: ill-defined (0)  Echogenic foci: macrocalcifications (1)  ACR TI-RADS total points: 4.  ACR TI-RADS risk category: TR4 (4-6 points).  ACR TI-RADS recommendations:  **Given size (>/= 1.5 cm) and appearance, fine needle aspiration of  this moderately suspicious nodule should be considered based on  TI-RADS criteria.  _________________________________________________________  Nodule # 2: Degenerated colloid cyst in the left lower gland nodules  up to 2.5 cm. Sonographically low risk/benign. No imaging follow-up.  IMPRESSION:  1. Surgical changes of prior right hemithyroidectomy.  2. Nodule # 1 is a 2.8 cm TI-RADS category 4 nodule in the left  upper gland and meets criteria to consider fine-needle aspiration  biopsy.  The above is in keeping with the ACR TI-RADS recommendations - J Am  Coll Radiol 2017;14:587-595.     Exam(s): [id number] CT/CT ANGIO CHEST  CLINICAL DATA:  Chest pain.  EXAM:  CT ANGIOGRAPHY CHEST WITH CONTRAST  TECHNIQUE:  Multidetector CT imaging of the chest was performed using the  standard protocol during bolus administration of intravenous  contrast. Multiplanar CT image reconstructions and MIPs were  obtained to evaluate the vascular anatomy.  RADIATION DOSE REDUCTION: This exam was performed according to the  departmental dose-optimization program which includes automated  exposure control, adjustment of the mA and/or kV according to  patient size and/or use of iterative reconstruction technique.  CONTRAST: 80 mL of Isovue 370 intravenously.  COMPARISON: None Available.  FINDINGS:  Cardiovascular: Satisfactory opacification of the pulmonary arteries  to the segmental level. No evidence of pulmonary embolism. Normal  heart size. No pericardial effusion.  Mediastinum/Nodes: 2 left-sided thyroid nodules are noted, each  measuring approximately 2.5 cm. Status post right thyroidectomy. No  adenopathy is noted. The esophagus is unremarkable.  Lungs/Pleura: No pneumothorax or pleural effusion is noted. Mild  biapical scarring is noted. 5 mm nodule is noted in right upper lobe  best seen on image number 78 of series 601.  Upper Abdomen: No acute abnormality.  Musculoskeletal: No chest wall abnormality. No acute or significant  osseous findings.  Review of the MIP images confirms  the above findings.  IMPRESSION: No definite evidence of pulmonary embolus.  Two left-sided thyroid nodules are noted, each measuring  approximately 2.5 cm. Recommend thyroid US. (Ref: J Am Coll Radiol.  2015 Feb;12(2): 143-50).  5 mm nodule noted in right upper lobe. Although likely benign, if  the patient is high-risk, given the morphology and/or location of  this nodule a non-contrast chest CT can be considered in 12  months.This recommendation follows the consensus statement:  Guidelines for Management of Incidental Pulmonary Nodules Detected  on CT  Images: From the Fleischner Society 2017; Radiology 2017;  284:228-243.  Aortic Atherosclerosis (ICD10-I70.0).    ASSESSMENT AND PLAN:  Ms.. Bozich is a pleasant 78 y.o. female    1.  History of stage IA hormone receptor positive left breast cancer.  Diagnosed in August 2016, treated with lumpectomy, adjuvant radiation therapy and adjuvant endocrine therapy with anastrozole 0.5 mg daily for 5 years. Ms. Robben is currently clinically and radiographically without evidence of disease or recurrence of her breast cancer.  She continues to follow with Dr. Georgiana Shore and she is scheduled [facility] in August.  The patient would like to have her mammograms done at [facility].  As she has additional issues, she will be seen for follow up as below.  2.  Incidental 5 mm right upper lobe pulmonary nodule. Due to her cancer history and probable exposure to asbestos, I would recommend repeat low dose CT chest in April. I will have her see Dr. Gilman Buttner after her scan.  3.  Left thyroid nodules, status post benign biopsy. We recommend continued annual thyroid ultrasound and will schedule that for May.  4.  Osteopenia. She is taking vitamin D, but not calcium. I recommended she start calcium daily.  5.  Basal cell carcinoma of the skin.  Status post Mohs surgery of the nose, and excision of the right posterior shoulder.  She follows up with dermatology annually.  6. Cancer screening:  Due to Ms. Mix history and age, she should continue screening for skin cancers and breast cancer  The patient was encouraged to follow-up with her PCP for appropriate cancer screenings.   7. Health maintenance and wellness promotion: Ms. Kirsten was encouraged to consume 5-7 servings of fruits and vegetables per day. The patient was also encouraged to engage in moderate to vigorous exercise for 30 minutes per day most days of the week. Ms. Parady was instructed to limit her alcohol consumption  and continue to abstain from tobacco use/.    Disposition:  -Follow-up with Dr. Gilman Buttner in April after repeat CT chest to follow-up on pulmonary nodule   90 minutes was spent in patient care.  This included time spent preparing to see the patient (e.g., review of tests), obtaining and/or reviewing separately obtained history, counseling and educating the patient/family/caregiver, ordering medications, tests, or procedures; documenting clinical information in the electronic or other health record, independently interpreting results and communicating results to the patient/family/caregiver as well as coordination of care.  The patient was staffed and plan formulated with Dr. Gilman Buttner.   My Madariaga A. Sol Passer, PA-C Physician Assistant [facility]   Note: PRIMARY CARE PROVIDER Natalie Mack, [place] [phone]

## 2023-07-27 NOTE — Telephone Encounter (Signed)
 Patient has been scheduled for follow-up visit per 07/27/23 LOS.  Pt given an appt calendar with date and time.

## 2023-07-28 ENCOUNTER — Encounter: Payer: Self-pay | Admitting: Hematology and Oncology

## 2023-09-14 DIAGNOSIS — Z85828 Personal history of other malignant neoplasm of skin: Secondary | ICD-10-CM | POA: Diagnosis not present

## 2023-09-14 DIAGNOSIS — D2261 Melanocytic nevi of right upper limb, including shoulder: Secondary | ICD-10-CM | POA: Diagnosis not present

## 2023-09-14 DIAGNOSIS — L821 Other seborrheic keratosis: Secondary | ICD-10-CM | POA: Diagnosis not present

## 2023-09-14 DIAGNOSIS — L814 Other melanin hyperpigmentation: Secondary | ICD-10-CM | POA: Diagnosis not present

## 2023-09-14 DIAGNOSIS — C44319 Basal cell carcinoma of skin of other parts of face: Secondary | ICD-10-CM | POA: Diagnosis not present

## 2023-09-14 DIAGNOSIS — B078 Other viral warts: Secondary | ICD-10-CM | POA: Diagnosis not present

## 2023-09-14 DIAGNOSIS — D220 Melanocytic nevi of lip: Secondary | ICD-10-CM | POA: Diagnosis not present

## 2023-09-14 DIAGNOSIS — L820 Inflamed seborrheic keratosis: Secondary | ICD-10-CM | POA: Diagnosis not present

## 2023-09-17 DIAGNOSIS — H2513 Age-related nuclear cataract, bilateral: Secondary | ICD-10-CM | POA: Diagnosis not present

## 2023-09-17 DIAGNOSIS — H35363 Drusen (degenerative) of macula, bilateral: Secondary | ICD-10-CM | POA: Diagnosis not present

## 2023-09-17 DIAGNOSIS — H40013 Open angle with borderline findings, low risk, bilateral: Secondary | ICD-10-CM | POA: Diagnosis not present

## 2023-09-17 DIAGNOSIS — H5203 Hypermetropia, bilateral: Secondary | ICD-10-CM | POA: Diagnosis not present

## 2023-09-17 DIAGNOSIS — D3132 Benign neoplasm of left choroid: Secondary | ICD-10-CM | POA: Diagnosis not present

## 2023-09-17 DIAGNOSIS — E119 Type 2 diabetes mellitus without complications: Secondary | ICD-10-CM | POA: Diagnosis not present

## 2023-09-17 DIAGNOSIS — H35372 Puckering of macula, left eye: Secondary | ICD-10-CM | POA: Diagnosis not present

## 2023-09-22 NOTE — Progress Notes (Addendum)
 [facility] [address] [place],  [place]  [address] [phone]  PATIENT CARE TEAM: Patient Care Team: Gaither Juba, MD as PCP - General (Family Medicine) Sandee Crook Margart Shears, MD as PCP - Cardiology (Cardiology) Lenita Quinones., MD as Referring Physician (Surgery)  DATE OF VISIT: 09/24/2023  CLINIC:  Long Term Survivorship   REASON FOR VISIT:  Routine follow-up for history of breast cancer  BRIEF ONCOLOGIC HISTORY:  Natalie Mack is a 78 y.o. 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  She was treated with lumpectomy.  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  2 sentinel nodes were negative for metastasis.  A medial margin was positive, so she underwent re-excision, which did not reveal any residual cancer.  Estrogen and progesterone receptors were positive and HER 2/Neu negative.  Ki 67 was 5%.  We discussed the option of Oncotype DX, but she felt she would not be interested in chemotherapy regardless, so this was not pursued.  She received adjuvant radiation to the left breast completed in November 2016.  Bone density scan in July 2016 was normal.  She was placed on anastrozole 1 mg daily in December 2016.  She had difficulty tolerating anastrozole 1 mg daily due to arthralgias, vaginal pressure and non refreshing sleep.  She discontinued anastrozole after about 2 months, then later started taking half a pill daily, which she tolerated fairly well.  Repeat bone density scan in August 2018 remained normal, with just slight worsening since 2008.  Bilateral diagnostic mammogram in August 2020 did not reveal any evidence of malignancy. She completed 5 years of anastrozole at 50% dose in December 2021.  Annual bilateral screening mammograms have remained without evidence of malignancy.   Bone density scan at her PCP office in 2023 apparently revealed osteopenia hip.  Oncology History  Breast cancer of upper-outer quadrant of left female breast (HCC)  01/22/2015 Cancer Staging   Staging form: Breast, AJCC 7th Edition - Clinical stage from 01/22/2015: Stage IA (T1c, N0, M0) - Signed by Nolia Baumgartner, MD on 07/31/2021 Staged by: Managing physician Diagnostic confirmation: Positive histology Specimen type: Excision Histopathologic type: Infiltrating duct carcinoma, NOS Stage prefix: Initial diagnosis Laterality: Left Tumor size (mm): 17 Method of lymph node assessment: Sentinel lymph node biopsy Histologic grade (G): G2 Lymph-vascular invasion (LVI): LVI not present (absent)/not identified Residual tumor (R): R0 - None Paget's disease: Negative Tumor grade (Scarff-Bloom-Richardson system): G2 Estrogen receptor status: Positive Progesterone receptor status: Positive HER2 status: Negative IHC of regional lymph nodes: Not assessed Multi-gene signature risk of recurrence: Not assessed Prognostic indicators: Took anastrazole x 5 years but only at 50% dose Stage used in treatment planning: Yes National guidelines used in treatment planning: Yes Type of national guideline used in treatment planning: NCCN   08/30/2015 Initial Diagnosis   Breast cancer of upper-outer quadrant of left female breast [facility])    INTERVAL HISTORY:  Natalie Mack [facility] today for routine follow-up for her history of breast cancer. Patient states that she feels well but complains of regular aches and pains and intermittent swelling of her thyroid  glands. She had nodules found on ultrasound and had a biopsy of this site in June, 2024 and was found to be benign. She had a ultrasound of the thyroid  on 10/07/2022 and CTA chest done  on 10/22/2022 which revealed a stable 5 mm nodule in the right upper lobe as well as multiple left thyroid  nodules.  Kelli ordered a CT chest and  ultrasound of the thyroid  for follow-up of these findings and the patient is waiting on this to be scheduled. Her BP today was 192/97 and when rechecked 183/97. She notes her BP being elevated when coming to the doctor's office and at home it was 140/80's, she checks her BP about once/week. She has a WBC of 6.7, hemoglobin of 13.9, and platelet count of 197,000. Her CMP is normal other than a mildly low sodium of 134. She will be due for a bone density scan this year and Dr. Geraldo Klippel will take care of that. If all is well with her scans I will see her back in 1 year with thyroid  ultrasound and repeat CT chest without contrast. She denies signs of infection such as sore throat, sinus drainage, cough, or urinary symptoms.  She denies fevers or recurrent chills. She denies nausea, vomiting, chest pain, dyspnea or cough. Her appetite is good and her weight has increased 2 pounds over last 2 months .  Dr. Jonita Neth will order her annual mammogram for August.    REVIEW OF SYSTEMS:  Review of Systems  Constitutional:  Negative for appetite change, chills, diaphoresis, fatigue, fever and unexpected weight change.  HENT:  Negative.  Negative for lump/mass, mouth sores, nosebleeds and sore throat.   Eyes: Negative.   Respiratory: Negative.  Negative for chest tightness, cough, hemoptysis, shortness of breath and wheezing.   Cardiovascular: Negative.  Negative for chest pain, leg swelling and palpitations.  Gastrointestinal: Negative.  Negative for abdominal distention, abdominal pain, blood in stool, constipation, diarrhea, nausea and vomiting.  Endocrine: Positive for hot flashes.  Genitourinary: Negative.  Negative for difficulty urinating, dysuria, frequency, hematuria, vaginal bleeding and vaginal discharge.   Musculoskeletal:  Positive for arthralgias (mild, generalized). Negative for back pain, flank pain, gait problem, myalgias and neck pain.  Skin: Negative.  Negative for rash.  Neurological:  Positive for  headaches (occasional, left temple). Negative for dizziness, extremity weakness, gait problem, light-headedness, numbness, seizures and speech difficulty.  Hematological: Negative.  Negative for adenopathy. Does not bruise/bleed easily.  Psychiatric/Behavioral: Negative.  Negative for depression and sleep disturbance. The patient is not nervous/anxious.      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   Breast cancer (HCC)    Breast cancer of upper-outer quadrant of left female breast (HCC) 08/30/2015   date of diagnosis is incorrect, should be 01/07/2015   Essential hypertension 09/19/2016   Estrogen receptor positive neoplasm 08/30/2015   Hyperlipidemia 02/01/2017   Hypokalemia 02/01/2017   Murmur 01/27/2017   Palpitation 09/19/2016   Overview:  Overview:  Rapid HR, not documented   Statin intolerance 02/01/2017    Past Surgical History:  Procedure Laterality Date   ABDOMINAL HYSTERECTOMY     APPENDECTOMY     BREAST LUMPECTOMY Left 01/30/2015   CESAREAN SECTION     CHOLECYSTECTOMY     MOHS SURGERY  02/2017   THYROID  SURGERY     TONSILLECTOMY       ALLERGIES:  Allergies  Allergen Reactions   Ciprofloxacin Other (See Comments)    Tongue swelling and mouth tingling   Nitrofuran Derivatives Swelling   Sulfa Antibiotics Hives   Potassium Chloride Other (See Comments)    Cramps and stomach pain  Hydralazine  Nausea And Vomiting   Hydrochlorothiazide  Other (See Comments)   Oxycodone Other (See Comments)    Unknown   Valsartan  Palpitations    Pt reports it made my heart race     CURRENT MEDICATIONS:  Outpatient Encounter Medications as of 07/27/2023  Medication Sig   aspirin  EC 81 MG tablet Take 1 tablet (81 mg total) by mouth daily. Swallow whole. (Patient taking differently: Take 81 mg by mouth daily. Taking every other dayd)   Calcium 600-200 MG-UNIT tablet  Take 1 tablet by mouth daily. (Patient not taking: Reported on 07/27/2023)   carvedilol (COREG) 12.5 MG tablet Take 12.5 mg by mouth in the morning and at bedtime.    Cholecalciferol (VITAMIN D3) 50 MCG (2000 UT) CAPS Take 2,000 Units by mouth daily.   famotidine (PEPCID) 20 MG tablet Take 40 mg by mouth at bedtime.   nitroGLYCERIN  (NITROSTAT ) 0.4 MG SL tablet Place 1 tablet (0.4 mg total) under the tongue every 5 (five) minutes as needed for chest pain.   pantoprazole (PROTONIX) 40 MG tablet Take 40 mg by mouth 2 (two) times daily. Only taking one in the morning,   telmisartan  (MICARDIS ) 80 MG tablet TAKE 1 TABLET BY MOUTH EVERY DAY   trimethoprim (TRIMPEX) 100 MG tablet Take 100 mg by mouth at bedtime. (Patient not taking: Reported on 07/27/2023)   No facility-administered encounter medications on file as of 07/27/2023.     ONCOLOGIC FAMILY HISTORY:  Family History  Problem Relation Age of Onset   Heart failure Mother    Heart attack Mother    Hypertension Father    Heart attack Maternal Grandmother    Breast cancer Paternal Aunt 54 - 21     SOCIAL HISTORY:   reports that she has never smoked. She has never used smokeless tobacco. She reports that she does not drink alcohol and does not use drugs.  Kemari Dyana Glade Rudie is married and lives with her spouse [place], [place] .  Ms. Franceschi  has 3 adult children.  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  She denies any current or history of tobacco, alcohol, or illicit drug use.    Vital Signs: Vitals:   09/24/23 0904 09/24/23 0936  BP: (!) 192/97 (!) 183/97  Pulse:    Resp:    Temp:    SpO2:     Wt Readings from Last 3 Encounters:  09/24/23 148 lb (67.1 kg)  07/27/23 146 lb 9.6 oz (66.5 kg)  12/11/22 144 lb 6.4 oz (65.5 kg)   PHYSICAL EXAMINATION:  Physical Exam Vitals and nursing note reviewed.  Constitutional:      General: She is not in acute distress.    Appearance: Normal appearance. She is normal  weight. She is not ill-appearing, toxic-appearing or diaphoretic.  HENT:     Head: Normocephalic and atraumatic.     Right Ear: Tympanic membrane, ear canal and external ear normal. There is no impacted cerumen.     Left Ear: Tympanic membrane, ear canal and external ear normal. There is no impacted cerumen.     Nose: Nose normal. No congestion or rhinorrhea.     Mouth/Throat:     Mouth: Mucous membranes are moist.     Pharynx: Oropharynx is clear. No oropharyngeal exudate or posterior oropharyngeal erythema.  Eyes:     General: No scleral icterus.       Right eye: No discharge.        Left eye: No discharge.  Extraocular Movements: Extraocular movements intact.     Conjunctiva/sclera: Conjunctivae normal.     Pupils: Pupils are equal, round, and reactive to light.  Neck:     Vascular: No carotid bruit.  Cardiovascular:     Rate and Rhythm: Normal rate and regular rhythm.     Pulses: Normal pulses.     Heart sounds: Normal heart sounds. No murmur heard.    No friction rub. No gallop.  Pulmonary:     Effort: Pulmonary effort is normal. No respiratory distress.     Breath sounds: Normal breath sounds. No stridor. No wheezing, rhonchi or rales.  Chest:     Chest wall: No tenderness.  Breasts:    Right: Normal. No swelling, bleeding, inverted nipple, mass, nipple discharge, skin change or tenderness.     Left: Normal. No swelling, bleeding, inverted nipple, mass, nipple discharge, skin change or tenderness.     Comments: Mild fibrocystic changes Deep scar in the upper outer quadrant of the left breast Firm area in that region consistent with resolving seroma  No masses in either breasts Abdominal:     General: Bowel sounds are normal. There is no distension.     Palpations: Abdomen is soft. There is no hepatomegaly, splenomegaly or mass.     Tenderness: There is no abdominal tenderness. There is no right CVA tenderness, left CVA tenderness, guarding or rebound.     Hernia: No  hernia is present.  Musculoskeletal:        General: No swelling, tenderness, deformity or signs of injury. Normal range of motion.     Cervical back: Normal range of motion and neck supple. No rigidity or tenderness.     Right lower leg: No edema.     Left lower leg: No edema.  Lymphadenopathy:     Cervical: No cervical adenopathy.     Right cervical: No superficial, deep or posterior cervical adenopathy.    Left cervical: No superficial, deep or posterior cervical adenopathy.     Upper Body:     Right upper body: No supraclavicular, axillary or pectoral adenopathy.     Left upper body: No supraclavicular, axillary or pectoral adenopathy.     Lower Body: No right inguinal adenopathy. No left inguinal adenopathy.  Skin:    General: Skin is warm and dry.     Coloration: Skin is not jaundiced or pale.     Findings: No bruising, erythema, lesion or rash.  Neurological:     General: No focal deficit present.     Mental Status: She is alert and oriented to person, place, and time. Mental status is at baseline.     Cranial Nerves: No cranial nerve deficit.     Sensory: No sensory deficit.     Motor: No weakness.     Coordination: Coordination normal.     Gait: Gait normal.     Deep Tendon Reflexes: Reflexes normal.  Psychiatric:        Mood and Affect: Mood normal.        Behavior: Behavior normal.        Thought Content: Thought content normal.        Judgment: Judgment normal.     LABORATORY DATA:     Latest Ref Rng & Units 09/24/2023    8:29 AM 10/15/2022    9:13 AM 10/23/2017    6:57 PM  CBC  WBC 4.0 - 10.5 K/uL 6.7  5.9  4.9   Hemoglobin 12.0 - 15.0 g/dL 13.9  13.3  13.7   Hematocrit 36.0 - 46.0 % 40.4  39.9  41.0   Platelets 150 - 400 K/uL 197  189  195       Latest Ref Rng & Units 09/24/2023    8:29 AM 10/15/2022    9:13 AM 01/09/2022    9:00 AM  CMP  Glucose 70 - 99 mg/dL 409  811  914   BUN 8 - 23 mg/dL 16  11  14    Creatinine 0.44 - 1.00 mg/dL 7.82  9.56  2.13    Sodium 135 - 145 mmol/L 134  139  139   Potassium 3.5 - 5.1 mmol/L 4.1  3.7  4.0   Chloride 98 - 111 mmol/L 100  99  101   CO2 22 - 32 mmol/L 27  26  24    Calcium 8.9 - 10.3 mg/dL 9.2  9.4  9.3   Total Protein 6.5 - 8.1 g/dL 6.5     Total Bilirubin 0.0 - 1.2 mg/dL 0.5     Alkaline Phos 38 - 126 U/L 104     AST 15 - 41 U/L 21     ALT 0 - 44 U/L 13       DIAGNOSTIC IMAGING:  No Studies Found.    ASSESSMENT AND PLAN:  Ms.. Mack is a pleasant 78 y.o. female   1.  History of stage IA hormone receptor positive left breast cancer.  Diagnosed in August 2016, treated with lumpectomy, adjuvant radiation therapy and adjuvant endocrine therapy with anastrozole 0.5 mg daily for 5 years. Natalie Mack is currently clinically and radiographically without evidence of disease or recurrence of her breast cancer.  She continues to follow with Dr. Jonita Neth and she is scheduled for mammogram at [facility] in August.  The patient would like to have her mammograms done at [facility].    2.  Incidental 5 mm right upper lobe pulmonary nodule. Due to her cancer history and probable exposure to asbestos, I would recommend repeat low dose CT chest in April/May and I will call her with that result.  3.  Left thyroid  nodules, status post benign biopsy. We recommend continued annual thyroid  ultrasound and will schedule that for May.  4.  Osteopenia. She is taking vitamin D, but not calcium. I recommended she start calcium daily. She will have her bone density scan scheduled through [facility].   5.  Basal cell carcinoma of the skin.  Status post Mohs surgery of the nose, and excision of the right posterior shoulder.  She follows up with dermatology annually.   6. Hypertension. It was still fairly high on recheck but she will monitor it at home and follow-up with Dr. Geraldo Klippel.   Disposition:  She had nodules found on ultrasound and had a biopsy of this site in June, 2024 and was found to  be benign. She had a ultrasound of the thyroid  on 10/07/2022 and CTA chest done on 10/22/2022 which revealed a stable 5 mm nodule in the right upper lobe as well as multiple left thyroid  nodules.  Kelli ordered a CT chest and ultrasound of the thyroid  for follow-up of these findings and the patient is waiting on this to be scheduled. Her BP today was 192/97 and when rechecked 183/97. She notes her BP being elevated when coming to the doctor's office, and at home it was 140/80's, she checks her BP about once per week. She has a WBC of 6.7, hemoglobin of 13.9, and  platelet count of 197,000. Her CMP is normal other than a mildly low sodium of 134. She will be due for a bone density scan this year and Dr. Geraldo Klippel will take care of that. If all is well with her scans I will see her back in 1 year with thyroid  ultrasound and repeat CT chest without contrast.   I provided 15 minutes of face-to-face time during this this encounter and > 50% was spent counseling as documented under my assessment and plan.   Nolia Baumgartner, MD  [facility] - A DEPT OF [facility] [address] [place] [address] Dept: [phone] Dept Fax: [phone]   No orders of the defined types were placed in this encounter.   Note: PRIMARY CARE PROVIDER Gaither Juba, MD [phone]   Ambrosio Bailey Lassiter,acting as a scribe for Nolia Baumgartner, MD.,have documented all relevant documentation on the behalf of Nolia Baumgartner, MD,as directed by  Nolia Baumgartner, MD while in the presence of Nolia Baumgartner, MD.

## 2023-09-24 ENCOUNTER — Inpatient Hospital Stay: Payer: Medicare PPO

## 2023-09-24 ENCOUNTER — Inpatient Hospital Stay: Payer: Medicare PPO | Attending: Oncology | Admitting: Oncology

## 2023-09-24 ENCOUNTER — Encounter: Payer: Self-pay | Admitting: Oncology

## 2023-09-24 VITALS — BP 183/97 | HR 68 | Temp 97.6°F | Resp 18 | Ht 67.0 in | Wt 148.0 lb

## 2023-09-24 DIAGNOSIS — Z90710 Acquired absence of both cervix and uterus: Secondary | ICD-10-CM | POA: Diagnosis not present

## 2023-09-24 DIAGNOSIS — R911 Solitary pulmonary nodule: Secondary | ICD-10-CM | POA: Diagnosis not present

## 2023-09-24 DIAGNOSIS — Z803 Family history of malignant neoplasm of breast: Secondary | ICD-10-CM | POA: Insufficient documentation

## 2023-09-24 DIAGNOSIS — Z85828 Personal history of other malignant neoplasm of skin: Secondary | ICD-10-CM | POA: Diagnosis not present

## 2023-09-24 DIAGNOSIS — C50412 Malignant neoplasm of upper-outer quadrant of left female breast: Secondary | ICD-10-CM

## 2023-09-24 DIAGNOSIS — Z9049 Acquired absence of other specified parts of digestive tract: Secondary | ICD-10-CM | POA: Diagnosis not present

## 2023-09-24 DIAGNOSIS — Z08 Encounter for follow-up examination after completed treatment for malignant neoplasm: Secondary | ICD-10-CM | POA: Diagnosis not present

## 2023-09-24 DIAGNOSIS — Z923 Personal history of irradiation: Secondary | ICD-10-CM | POA: Insufficient documentation

## 2023-09-24 DIAGNOSIS — I10 Essential (primary) hypertension: Secondary | ICD-10-CM | POA: Insufficient documentation

## 2023-09-24 DIAGNOSIS — E785 Hyperlipidemia, unspecified: Secondary | ICD-10-CM | POA: Insufficient documentation

## 2023-09-24 DIAGNOSIS — Z881 Allergy status to other antibiotic agents status: Secondary | ICD-10-CM | POA: Insufficient documentation

## 2023-09-24 DIAGNOSIS — Z853 Personal history of malignant neoplasm of breast: Secondary | ICD-10-CM | POA: Insufficient documentation

## 2023-09-24 DIAGNOSIS — Z77090 Contact with and (suspected) exposure to asbestos: Secondary | ICD-10-CM

## 2023-09-24 DIAGNOSIS — Z882 Allergy status to sulfonamides status: Secondary | ICD-10-CM | POA: Insufficient documentation

## 2023-09-24 DIAGNOSIS — Z885 Allergy status to narcotic agent status: Secondary | ICD-10-CM | POA: Insufficient documentation

## 2023-09-24 DIAGNOSIS — Z170 Estrogen receptor positive status [ER+]: Secondary | ICD-10-CM | POA: Diagnosis not present

## 2023-09-24 DIAGNOSIS — M85859 Other specified disorders of bone density and structure, unspecified thigh: Secondary | ICD-10-CM | POA: Insufficient documentation

## 2023-09-24 DIAGNOSIS — Z79899 Other long term (current) drug therapy: Secondary | ICD-10-CM | POA: Insufficient documentation

## 2023-09-24 DIAGNOSIS — E042 Nontoxic multinodular goiter: Secondary | ICD-10-CM | POA: Insufficient documentation

## 2023-09-24 LAB — CMP (CANCER CENTER ONLY)
ALT: 13 U/L (ref 0–44)
AST: 21 U/L (ref 15–41)
Albumin: 4.3 g/dL (ref 3.5–5.0)
Alkaline Phosphatase: 104 U/L (ref 38–126)
Anion gap: 7 (ref 5–15)
BUN: 16 mg/dL (ref 8–23)
CO2: 27 mmol/L (ref 22–32)
Calcium: 9.2 mg/dL (ref 8.9–10.3)
Chloride: 100 mmol/L (ref 98–111)
Creatinine: 0.75 mg/dL (ref 0.44–1.00)
GFR, Estimated: >60 mL/min (ref >60)
Glucose, Bld: 115 mg/dL — ABNORMAL HIGH (ref 70–99)
Potassium: 4.1 mmol/L (ref 3.5–5.1)
Sodium: 134 mmol/L — ABNORMAL LOW (ref 135–145)
Total Bilirubin: 0.5 mg/dL (ref 0.0–1.2)
Total Protein: 6.5 g/dL (ref 6.5–8.1)

## 2023-09-24 LAB — CBC (CANCER CENTER ONLY)
HCT: 40.4 % (ref 36.0–46.0)
Hemoglobin: 13.9 g/dL (ref 12.0–15.0)
MCH: 29 pg (ref 26.0–34.0)
MCHC: 34.4 g/dL (ref 30.0–36.0)
MCV: 84.3 fL (ref 80.0–100.0)
Platelet Count: 197 10*3/uL (ref 150–400)
RBC: 4.79 MIL/uL (ref 3.87–5.11)
RDW: 12.4 % (ref 11.5–15.5)
WBC Count: 6.7 10*3/uL (ref 4.0–10.5)
nRBC: 0 % (ref 0.0–0.2)

## 2023-09-28 DIAGNOSIS — Z85828 Personal history of other malignant neoplasm of skin: Secondary | ICD-10-CM | POA: Diagnosis not present

## 2023-09-28 DIAGNOSIS — C44319 Basal cell carcinoma of skin of other parts of face: Secondary | ICD-10-CM | POA: Diagnosis not present

## 2023-10-08 ENCOUNTER — Ambulatory Visit (INDEPENDENT_AMBULATORY_CARE_PROVIDER_SITE_OTHER)
Admission: RE | Admit: 2023-10-08 | Discharge: 2023-10-08 | Disposition: A | Discharge status: Home / Self Care | Source: Ambulatory Visit | Attending: Hematology and Oncology | Admitting: Hematology and Oncology

## 2023-10-08 ENCOUNTER — Ambulatory Visit (HOSPITAL_BASED_OUTPATIENT_CLINIC_OR_DEPARTMENT_OTHER)
Admission: RE | Admit: 2023-10-08 | Discharge: 2023-10-08 | Disposition: A | Discharge status: Home / Self Care | Source: Ambulatory Visit | Attending: Hematology and Oncology | Admitting: Hematology and Oncology

## 2023-10-08 DIAGNOSIS — Z853 Personal history of malignant neoplasm of breast: Secondary | ICD-10-CM

## 2023-10-08 DIAGNOSIS — K449 Diaphragmatic hernia without obstruction or gangrene: Secondary | ICD-10-CM | POA: Diagnosis not present

## 2023-10-08 DIAGNOSIS — E042 Nontoxic multinodular goiter: Secondary | ICD-10-CM | POA: Diagnosis not present

## 2023-10-08 DIAGNOSIS — Z77090 Contact with and (suspected) exposure to asbestos: Secondary | ICD-10-CM

## 2023-10-08 DIAGNOSIS — R911 Solitary pulmonary nodule: Secondary | ICD-10-CM

## 2023-10-08 DIAGNOSIS — I700 Atherosclerosis of aorta: Secondary | ICD-10-CM | POA: Diagnosis not present

## 2023-10-08 DIAGNOSIS — E041 Nontoxic single thyroid nodule: Secondary | ICD-10-CM | POA: Diagnosis not present

## 2023-10-12 ENCOUNTER — Telehealth: Payer: Self-pay

## 2023-10-12 NOTE — Telephone Encounter (Signed)
-----   Message from Alfonso Ike sent at 10/12/2023  7:53 AM EDT ----- Please let her know her thyroid  u/s is stable, thanks

## 2023-10-12 NOTE — Telephone Encounter (Signed)
 Detailed message left for patient.

## 2023-10-13 ENCOUNTER — Telehealth: Payer: Self-pay

## 2023-10-13 NOTE — Telephone Encounter (Signed)
 Pt called & stated she got message that thyroid  ultrasound was stable. She hasn't heard any results from the CT of lungs done next door. She was wondering if it had been read yet?

## 2023-10-15 ENCOUNTER — Telehealth: Payer: Self-pay | Admitting: Hematology and Oncology

## 2023-10-15 DIAGNOSIS — E042 Nontoxic multinodular goiter: Secondary | ICD-10-CM

## 2023-10-15 NOTE — Telephone Encounter (Signed)
 Spoke with patient regarding CT chest results.  There is a stable tiny nodule in the right upper lobe.  Continued follow-up is not recommended.  She will still need annual thyroid  ultrasound.

## 2023-10-15 NOTE — Telephone Encounter (Signed)
Scan has been read

## 2023-12-09 ENCOUNTER — Ambulatory Visit

## 2023-12-09 ENCOUNTER — Telehealth: Payer: Self-pay

## 2023-12-09 ENCOUNTER — Ambulatory Visit: Admitting: Cardiology

## 2023-12-09 VITALS — BP 152/84 | HR 68 | Ht 67.0 in | Wt 147.8 lb

## 2023-12-09 DIAGNOSIS — I2510 Atherosclerotic heart disease of native coronary artery without angina pectoris: Secondary | ICD-10-CM | POA: Insufficient documentation

## 2023-12-09 DIAGNOSIS — E782 Mixed hyperlipidemia: Secondary | ICD-10-CM | POA: Diagnosis not present

## 2023-12-09 DIAGNOSIS — I10 Essential (primary) hypertension: Secondary | ICD-10-CM | POA: Diagnosis not present

## 2023-12-09 HISTORY — DX: Atherosclerotic heart disease of native coronary artery without angina pectoris: I25.10

## 2023-12-09 NOTE — Assessment & Plan Note (Signed)
 Elevated blood pressure in the office. Home blood pressure readings are well-controlled. Advised her to bring her home device next week for a nurse visit to assess accuracy and validity of her readings.  If accurate, we will chalk her elevated blood pressure readings at the office visits to whitecoat hypertension. Continue with her current doses carvedilol 12.5 mg twice daily and telmisartan  80 mg once daily.  If inaccurate home equipment, and findings suggest elevated blood pressures, will add amlodipine.

## 2023-12-09 NOTE — Assessment & Plan Note (Signed)
 Last lipid panel assessment from 07-06-2023 total cholesterol 245, HDL 58, LDL 159, triglycerides 103. Prefers not to be on lipid-lowering therapy including statins or Zetia.  Advised continued diet and lifestyle modifications.  Her coronary atherosclerosis as evaluated on cardiac CT was minimal Will revisit possible need for Zetia if she is agreeable at subsequent visit.

## 2023-12-09 NOTE — Progress Notes (Signed)
 Cardiology Consultation:    Date:  12/09/2023   ID:  Natalie Mack, DOB [birth year], MRN [id number]  PCP:  [name], MD  Cardiologist:  Alean SAUNDERS Delorse Shane, MD   Referring MD: [name], MD   No chief complaint on file.    ASSESSMENT AND PLAN:   Natalie Mack 78 year old woman with history of minimal nonobstructive coronary atherosclerosis on cardiac CT May 2024, hypertension, hyperlipidemia, statin intolerance, palpitations with short runs of SVT lasting up to 15 beat noted on Zio patch in November 2022, breast cancer in 2016 Problem List Items Addressed This Visit     Essential hypertension   Elevated blood pressure in the office. Home blood pressure readings are well-controlled. Advised her to bring her home device next week for a nurse visit to assess accuracy and validity of her readings.  If accurate, we will chalk her elevated blood pressure readings at the office visits to whitecoat hypertension. Continue with her current doses carvedilol 12.5 mg twice daily and telmisartan  80 mg once daily.  If inaccurate home equipment, and findings suggest elevated blood pressures, will add amlodipine.       Relevant Orders   EKG 12-Lead (Completed)   Hyperlipidemia   Last lipid panel assessment from 07-06-2023 total cholesterol 245, HDL 58, LDL 159, triglycerides 103. Prefers not to be on lipid-lowering therapy including statins or Zetia.  Advised continued diet and lifestyle modifications.  Her coronary atherosclerosis as evaluated on cardiac CT was minimal Will revisit possible need for Zetia if she is agreeable at subsequent visit.      Relevant Orders   EKG 12-Lead (Completed)   CAD (coronary artery disease) - Primary   Coronary atherosclerosis minimal nonobstructive. Good functional capacity Takes aspirin  81 mg every other day, continue the same. Reports statin intolerance and hesitancy to try statins or other lipid-lowering therapies such as Zetia. Continue  with healthy diet and lifestyle.       Relevant Orders   EKG 12-Lead (Completed)    Tentatively follow-up in the office in 6 months.   History of Present Illness:    Natalie Mack is a 79 y.o. female who is being seen today for follow-up visit. PCP [name], MD. Last visit with us  in the office was 12/11/2022 with Delon Hoover, NP-C.  Pleasant woman here for the visit by herself.  Lives with her husband at home.  With his vision getting impaired she is the one driving the car.  Keeps herself busy with household activities.  History of hypertension, hyperlipidemia, statin intolerance, palpitations with short runs of SVT noted on Zio patch in November 2022, breast cancer in 2016, last stress test with nuclear imaging at [facility] 09/16/2022 noted no ischemia [study was complicated with hypotension after Lexiscan  and transient 2-1 AV block], echocardiogram April 2024 at [facility] noted LVEF 60 to 65%, diastolic dysfunction likely restrictive pattern but without significant valve abnormalities.  Subsequently with ongoing complaints of chest pain cardiac CT performed 10/22/2022 noted minimal nonobstructive coronary atherosclerosis with calcium score 1.8 and CAD RADS 1 study and aortic atherosclerosis, incidental finding of 5 mm right upper lobe pulmonary nodule follow-up CT chest May 2025 noted stable lesions.  Mentions overall she is doing well.  Denies any palpitations, lightheadedness, dizziness or syncopal episodes.  No chest pain.  Mentions blood pressures at home usually checked are well-controlled and she was able to show me a log over the past month which notes good blood pressure readings  well-controlled systolic below 130s.  Advised her to bring in the device to have it crosscheck with our office equipment. Recheck of blood pressure myself manually also noted elevated blood pressures with systolic 150 mm Hg.  EKG in the clinic today shows sinus rhythm heart  rate 68/min, PR interval mildly prolonged 214 ms, QRS duration 66 ms with anteroseptal Q wave morphology no significant change in comparison to prior EKG from Oct 23, 2017.  Last blood work from 09/24/2023 CBC unremarkable. CMP with sodium mildly reduced 134.  Normal transaminases and alkaline phosphatase. 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 Swallow whole. (Patient taking differently: Take 81 mg by mouth daily. Taking every other dayd)   Calcium 600-200 MG-UNIT tablet Take 1 tablet by mouth daily.   carvedilol (COREG) 12.5 MG tablet Take 12.5 mg by mouth in the morning and at bedtime.    Cholecalciferol (VITAMIN D3) 50 MCG (2000 UT) CAPS Take 2,000 Units by mouth daily.   famotidine (PEPCID) 20 MG tablet Take 40 mg by mouth at bedtime. (Patient taking differently: Take 40 mg by mouth as needed for indigestion.)   nitroGLYCERIN  (NITROSTAT ) 0.4 MG SL tablet Place 1 tablet (0.4 mg total) under the tongue every 5 (five) minutes as needed for chest pain.   pantoprazole (PROTONIX) 40  MG tablet Take 40 mg by mouth 2 (two) times daily. Only taking one in the morning, (Patient taking differently: Take 40 mg by mouth as needed (heartburn). Only taking one in the morning,)   telmisartan  (MICARDIS ) 80 MG tablet TAKE 1 TABLET BY MOUTH EVERY DAY   trimethoprim (TRIMPEX) 100 MG tablet Take 100 mg by mouth at bedtime.     Allergies:   Ciprofloxacin, Nitrofuran derivatives, Sulfa antibiotics, Potassium chloride, Hydralazine , Hydrochlorothiazide , Oxycodone, and Valsartan    Social History   Socioeconomic History   Marital status: Married    Spouse name: Not on file   Number of children: Not on file   Years of education: Not on file   Highest education level: Not on file  Occupational History   Not on file  Tobacco Use   Smoking status: Never   Smokeless tobacco: Never  Vaping Use   Vaping status: Never Used  Substance and Sexual Activity   Alcohol use: No   Drug use: No   Sexual activity: Not on file  Other Topics Concern   Not on file  Social History Narrative   Not on file   Social Drivers of  Health   Financial Resource Strain: Not on file  Food Insecurity: Not on file  Transportation Needs: Not on file  Physical Activity: Not on file  Stress: Not on file  Social Connections: Not on file     Family History: The patient's family history includes Breast cancer (age of onset: 39 - 34) in her paternal aunt; Heart attack in her maternal grandmother and mother; Heart failure in her mother; Hypertension in her father. ROS:   Please see the history of present illness.    All 14 point review of systems negative except as described per history of present illness.  EKGs/Labs/Other Studies Reviewed:    The following studies were reviewed today:   EKG:  EKG Interpretation Date/Time:  Thursday December 09 2023 08:30:26 EDT Ventricular Rate:  68 PR Interval:  214 QRS Duration:  66 QT Interval:  394 QTC Calculation: 418 R Axis:   17  Text Interpretation: Sinus  rhythm with 1st degree A-V block Low voltage QRS Cannot rule out Anteroseptal infarct (cited on or before 09-Dec-2023) When compared with ECG of 23-Oct-2017 18:14, No significant change was found Confirmed by Liborio Hai reddy [phone]) on 12/09/2023 9:10:14 AM    Recent Labs: 09/24/2023: ALT 13; BUN 16; Creatinine 0.75; Hemoglobin 13.9; Platelet Count 197; Potassium 4.1; Sodium 134  Recent Lipid Panel    Component Value Date/Time   CHOL 242 (H) 01/13/2021 1335   TRIG 166 (H) 01/13/2021 1335   HDL 53 01/13/2021 1335   CHOLHDL 4.6 (H) 01/13/2021 1335   LDLCALC 159 (H) 01/13/2021 1335    Physical Exam:    VS:  BP (!) 150/102 (BP Location: Right Arm, Patient Position: Sitting, Cuff Size: Normal)   Pulse 68   Ht 5' 7 (1.702 m)   Wt 147 lb 12.8 oz (67 kg)   SpO2 98%   BMI 23.15 kg/m     Wt Readings from Last 3 Encounters:  12/09/23 147 lb 12.8 oz (67 kg)  09/24/23 148 lb (67.1 kg)  07/27/23 146 lb 9.6 oz (66.5 kg)     GENERAL:  Well nourished, well developed in no acute distress NECK: No JVD; No carotid bruits CARDIAC: RRR, S1 and S2 present, no murmurs, no rubs, no gallops CHEST:  Clear to auscultation without rales, wheezing or rhonchi  Extremities: No pitting pedal edema. Pulses bilaterally symmetric with radial 2+ and dorsalis pedis 2+ NEUROLOGIC:  Alert and oriented x 3  Medication Adjustments/Labs and Tests Ordered: Current medicines are reviewed at length with the patient today.  Concerns regarding medicines are outlined above.  Orders Placed This Encounter  Procedures   EKG 12-Lead   No orders of the defined types were placed in this encounter.   Signed, Hai jess Liborio, MD, MPH, [facility]. 12/09/2023 9:28 AM    [facility]

## 2023-12-09 NOTE — Telephone Encounter (Signed)
 Pt needing to reschedule nurse visit, requesting cb

## 2023-12-09 NOTE — Telephone Encounter (Signed)
 Called patient and rescheduled NV/kbl 12/09/23

## 2023-12-09 NOTE — Assessment & Plan Note (Signed)
 Coronary atherosclerosis minimal nonobstructive. Good functional capacity Takes aspirin  81 mg every other day, continue the same. Reports statin intolerance and hesitancy to try statins or other lipid-lowering therapies such as Zetia. Continue with healthy diet and lifestyle.

## 2023-12-09 NOTE — Patient Instructions (Signed)

## 2023-12-16 ENCOUNTER — Ambulatory Visit

## 2023-12-16 VITALS — BP 190/100 | HR 70 | Resp 18 | Ht 67.0 in | Wt 149.4 lb

## 2023-12-16 DIAGNOSIS — I10 Essential (primary) hypertension: Secondary | ICD-10-CM

## 2023-12-16 MED ORDER — AMLODIPINE BESYLATE 10 MG PO TABS: 10.0000 mg | ORAL_TABLET | Freq: Every day | ORAL | 3 refills | Status: DC

## 2023-12-16 MED ORDER — CARVEDILOL 25 MG PO TABS
25.0000 mg | ORAL_TABLET | Freq: Two times a day (BID) | ORAL | 3 refills | Status: AC
Start: 2023-12-16 — End: ?

## 2023-12-16 NOTE — Progress Notes (Signed)
   Nurse Visit   Date of Encounter: 12/16/2023 ID: Natalie Mack, DOB [birth year], MRN [id number]  PCP:  [name], MD   [facility] Providers Cardiologist:  Redell Leiter, MD      Visit Details   VS:  BP (!) 190/100 (BP Location: Right Arm, Patient Position: Sitting, Cuff Size: Normal)   Pulse 70   Resp 18   Ht 5' 7 (1.702 m)   Wt 149 lb 6.4 oz (67.8 kg)   BMI 23.40 kg/m  , BMI Body mass index is 23.4 kg/m.  Wt Readings from Last 3 Encounters:  12/16/23 149 lb 6.4 oz (67.8 kg)  12/09/23 147 lb 12.8 oz (67 kg)  09/24/23 148 lb (67.1 kg)     Reason for visit: BP cuff validation Performed today: Vitals, Provider consulted:[name], and Education Changes (medications, testing, etc.) : Increase your Carvedilol  to 25 mg BID and start 10 mg Amlodipine . BP log gave to pt to send in 1 week after medication changes. Our reading 190/100 pt was 207/105 Length of Visit: 20 minutes    Medications Adjustments/Labs and Tests Ordered: No orders of the defined types were placed in this encounter.  Meds ordered this encounter  Medications   carvedilol  (COREG ) 25 MG tablet    Sig: Take 1 tablet (25 mg total) by mouth in the morning and at bedtime.    Dispense:  180 tablet    Refill:  3   amLODipine  (NORVASC ) 10 MG tablet    Sig: Take 1 tablet (10 mg total) by mouth daily.    Dispense:  90 tablet    Refill:  3     Signed, Melene Meissner, RN  12/16/2023 10:07 AM

## 2023-12-16 NOTE — Patient Instructions (Signed)
 Medication Instructions:  Your physician has recommended you make the following change in your medication:  Increase your Carvedilol  to 25 mg twice daily  Start Amlodipine  10 mg daily  *If you need a refill on your cardiac medications before your next appointment, please call your pharmacy*   Lab Work: None ordered If you have labs (blood work) drawn today and your tests are completely normal, you will receive your results only by: MyChart Message (if you have MyChart) OR A paper copy in the mail If you have any lab test that is abnormal or we need to change your treatment, we will call you to review the results.   Testing/Procedures: None ordered   Follow-Up: At [facility], you and your health needs are our priority.  As part of our continuing mission to provide you with exceptional heart care, we have created designated Provider Care Teams.  These Care Teams include your primary Cardiologist (physician) and Advanced Practice Providers (APPs -  Physician Assistants and Nurse Practitioners) who all work together to provide you with the care you need, when you need it.  We recommend signing up for the patient portal called MyChart.  Sign up information is provided on this After Visit Summary.  MyChart is used to connect with patients for Virtual Visits (Telemedicine).  Patients are able to view lab/test results, encounter notes, upcoming appointments, etc.  Non-urgent messages can be sent to your provider as well.   To learn more about what you can do with MyChart, go to [email or web address].    Your next appointment:   As directed    The format for your next appointment:   In Person  Provider:   Alean Kobus, MD    Other Instructions none  Important Information About Sugar

## 2023-12-22 DIAGNOSIS — N3020 Other chronic cystitis without hematuria: Secondary | ICD-10-CM | POA: Diagnosis not present

## 2023-12-22 DIAGNOSIS — R300 Dysuria: Secondary | ICD-10-CM | POA: Diagnosis not present

## 2024-01-21 ENCOUNTER — Other Ambulatory Visit: Payer: Self-pay | Admitting: Cardiology

## 2024-01-28 DIAGNOSIS — Z1231 Encounter for screening mammogram for malignant neoplasm of breast: Secondary | ICD-10-CM | POA: Diagnosis not present

## 2024-02-02 DIAGNOSIS — R8271 Bacteriuria: Secondary | ICD-10-CM | POA: Diagnosis not present

## 2024-02-02 DIAGNOSIS — N3020 Other chronic cystitis without hematuria: Secondary | ICD-10-CM | POA: Diagnosis not present

## 2024-02-08 DIAGNOSIS — Z170 Estrogen receptor positive status [ER+]: Secondary | ICD-10-CM | POA: Diagnosis not present

## 2024-02-08 DIAGNOSIS — C50412 Malignant neoplasm of upper-outer quadrant of left female breast: Secondary | ICD-10-CM | POA: Diagnosis not present

## 2024-02-23 DIAGNOSIS — R3121 Asymptomatic microscopic hematuria: Secondary | ICD-10-CM | POA: Diagnosis not present

## 2024-02-23 DIAGNOSIS — R300 Dysuria: Secondary | ICD-10-CM | POA: Diagnosis not present

## 2024-03-13 DIAGNOSIS — R399 Unspecified symptoms and signs involving the genitourinary system: Secondary | ICD-10-CM | POA: Diagnosis not present

## 2024-03-13 DIAGNOSIS — N952 Postmenopausal atrophic vaginitis: Secondary | ICD-10-CM | POA: Diagnosis not present

## 2024-03-13 DIAGNOSIS — N3020 Other chronic cystitis without hematuria: Secondary | ICD-10-CM | POA: Diagnosis not present

## 2024-04-21 DIAGNOSIS — E119 Type 2 diabetes mellitus without complications: Secondary | ICD-10-CM | POA: Diagnosis not present

## 2024-04-21 DIAGNOSIS — H5203 Hypermetropia, bilateral: Secondary | ICD-10-CM | POA: Diagnosis not present

## 2024-06-09 ENCOUNTER — Ambulatory Visit

## 2024-06-09 VITALS — BP 174/96 | HR 72 | Ht 67.0 in | Wt 153.4 lb

## 2024-06-09 DIAGNOSIS — E782 Mixed hyperlipidemia: Secondary | ICD-10-CM | POA: Diagnosis not present

## 2024-06-09 DIAGNOSIS — I10 Essential (primary) hypertension: Secondary | ICD-10-CM

## 2024-06-09 DIAGNOSIS — I2510 Atherosclerotic heart disease of native coronary artery without angina pectoris: Secondary | ICD-10-CM

## 2024-06-09 NOTE — Assessment & Plan Note (Signed)
 Suboptimal on last lipid panel from 07/06/2023. She prefers not to take any statins or Zetia due to side effects previously when she tried these medications.  She has pending visit with her PCP and prefers to have blood work done through PCPs office at annual physical. She tells me currently she is not interested in considering Repatha or Praluent. She is familiar with Repatha, helping her husband to take his medications in the past.  She currently wishes to hold off on any lipid-lowering therapy and wants to address this further after visit with her PCP and lab work.

## 2024-06-09 NOTE — Patient Instructions (Signed)
 Medication Instructions:  Your physician recommends that you continue on your current medications as directed. Please refer to the Current Medication list given to you today.  *If you need a refill on your cardiac medications before your next appointment, please call your pharmacy*   Lab Work: None ordered If you have labs (blood work) drawn today and your tests are completely normal, you will receive your results only by: MyChart Message (if you have MyChart) OR A paper copy in the mail If you have any lab test that is abnormal or we need to change your treatment, we will call you to review the results.   Testing/Procedures:  Please arrive 15 minutes prior to your appointment time for registration and insurance purposes.  The test will take approximately 45 minutes to complete.  How to prepare for your Exercise Stress Test: Do bring a list of your current medications with you.  If not listed below, you may take your medications as normal. Do not take carvedilol  (Coreg ) for 24 hours prior to the test.  Bring the medication to your appointment as you may be required to take it once the test is complete. Do wear comfortable clothes (no dresses or overalls) and walking shoes, tennis shoes preferred (no heels or open toed shoes are allowed) Do Not wear cologne, perfume, aftershave or lotions (deodorant is allowed).  If these instructions are not followed, your test will have to be rescheduled.  If you have questions or concerns about your appointment, you can call the Stress Lab at [phone].  If you cannot keep your appointment, please provide 24 hours notification to the Stress Lab, to avoid a possible $50 charge to your account.   Follow-Up: At [facility], you and your health needs are our priority.  As part of our continuing mission to provide you with exceptional heart care, we have created designated Provider Care Teams.  These Care Teams include your primary  Cardiologist (physician) and Advanced Practice Providers (APPs -  Physician Assistants and Nurse Practitioners) who all work together to provide you with the care you need, when you need it.  We recommend signing up for the patient portal called MyChart.  Sign up information is provided on this After Visit Summary.  MyChart is used to connect with patients for Virtual Visits (Telemedicine).  Patients are able to view lab/test results, encounter notes, upcoming appointments, etc.  Non-urgent messages can be sent to your provider as well.   To learn more about what you can do with MyChart, go to [email or web address].    Your next appointment:   6 month(s)  The format for your next appointment:   In Person  Provider:   Alean Kobus, MD    Other Instructions Exercise Stress Test An exercise stress test is a test to check how your heart works during exercise. You will need to walk on a treadmill or ride an exercise bike for this test. An electrocardiogram (ECG) will record your heartbeat when you are at rest and when you are exercising. You may have an ultrasound or nuclear scan after the exercise test. The test is done to check for coronary artery disease (CAD). It is also done to: See how well you can exercise. Watch for high blood pressure during exercise. Test how well you can exercise after treatment. Check the blood flow to your arms and legs. If your test result is not normal, more testing may be needed. Tell a doctor about: Any allergies you have. All medicines  you are taking. This includes vitamins, herbs, eye drops, creams, and over-the-counter medicines. Any surgeries you have had. Tell your doctor if you have a pacemaker or a defibrillator called an ICD. Any bleeding problems you have. Any medical conditions you have. Whether you are pregnant or may be pregnant. What are the risks? Pain or pressure in the following areas: Chest. Jaw or neck. Between your shoulder  blades. Down your left arm. Legs. Feeling dizzy or light-headed. Shortness of breath. Irregular heartbeat. Feeling like you may vomit (nausea) or vomiting. What happens before the test? Follow instructions from your doctor about what you cannot eat or drink. Do not have any drinks or foods that have caffeine in them for 24 hours before the test, or as told by your doctor. This includes coffee, tea (even decaf tea), sodas, chocolate, and cocoa. Ask your doctor about changing or stopping: Over-the-counter medicines. Vitamins, herbs, and supplements. Your normal medicines. This is important if: You take diabetes medicines. Ask how to take insulin or pills. You may be told to change your insulin dose the morning of the test. You take beta-blocker medicines. These medicines may cause problems in your test. You may be told to stop taking them 24 hours before the test. You wear a nitroglycerin  patch. The patch may need to be taken off before the test. Do not smoke or use any products that contain nicotine or tobacco for 4 hours before the test. If you need help quitting, ask your doctor. If you use an inhaler, bring it with you to the test. Do not put lotions, powders, creams, or oils on your chest before the test. Wear comfortable shoes and loose-fitting clothing. What happens during the test?  Patches (electrodes) will be put on your chest. Wires will be connected to the patches. The wires will send signals to a machine to record your heartbeat. Your heart rate will be watched while you are resting and while you are exercising. Your blood pressure and oxygen levels will also be watched during the test. You will walk on a treadmill or use an exercise bike. If you cannot use these, you may be asked to turn a crank with your hands. The activity will get harder and will raise your heart rate. You may be asked to breathe into a tube a few times during the test. This measures the gases that you  breathe out. You will be asked how you are feeling throughout the test. You will exercise until your heart reaches a target heart rate. You will stop early if: You have chest pain. You feel dizzy. You are out of breath. You are too tired to keep going. Your blood pressure is too high or too low. You have an irregular heartbeat. You have pain or aching in your arms or legs. The test may vary among doctors and hospitals. What can I expect after the test? You will be monitored until you leave the hospital or clinic. This includes checking your blood pressure, heart rate, breathing rate, and blood oxygen level. You may return to your normal diet and activities as told by your doctor. It is up to you to get the results of your test. Ask how to get your results when they are ready. Summary An exercise stress test is a test to check how your heart works during exercise. This test is done to check for coronary artery disease. Your heart rate will be watched while you are resting and while you are exercising. Follow instructions  from your doctor about what you cannot eat or drink before the test. This information is not intended to replace advice given to you by your health care provider. Make sure you discuss any questions you have with your health care provider. Document Revised: 04/08/2021 Document Reviewed: 04/08/2021 Elsevier Patient Education  2024 [employer]  Important Information About Sugar

## 2024-06-09 NOTE — Assessment & Plan Note (Signed)
 Suboptimal blood pressure in the office today. Suspicious for whitecoat hypertension.  Blood pressure readings at home better controlled. Target blood pressure below 140/80 mmHg.  Continue carvedilol  25 mg twice daily Switch telmisartan  to 80 mg in the evening.  If blood pressures consistently above 140/80 mmHg will add low-dose spironolactone.

## 2024-06-09 NOTE — Progress Notes (Signed)
 "  Cardiology Consultation:    Date:  06/09/2024   ID:  Natalie Mack, DOB 05/10/1946, MRN [id number]  PCP:  Natalie Marcellus RAMAN, MD  Cardiologist:  Natalie SAUNDERS Seona Clemenson, MD   Referring MD: Natalie Marcellus RAMAN, MD   No chief complaint on file.    ASSESSMENT AND PLAN:   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     Here for follow-up visit.  Problem List Items Addressed This Visit       Cardiovascular and Mediastinum   Essential hypertension   Suboptimal blood pressure in the office today. Suspicious for whitecoat hypertension.  Blood pressure readings at home better controlled. Target blood pressure below 140/80 mmHg.  Continue carvedilol  25 mg twice daily Switch telmisartan  to 80 mg in the evening.  If blood pressures consistently above 140/80 mmHg will add low-dose spironolactone.       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   ongoing complaints of chest pain cardiac CT performed 10/22/2022 noted minimal nonobstructive  coronary atherosclerosis with calcium score 1.8 and CAD RADS 1 study and aortic atherosclerosis, incidental finding of 5 mm right upper lobe pulmonary nodule follow-up CT chest May 2025 noted stable lesions.  Good functional status. Reported mild atypical chest sensation while walking on the track or on treadmill.  Will reassess with treadmill EKG stress test to objectively assess her heart rate response, blood pressure response and rule out any significant ischemia.  Continue her current medication carvedilol  25 mg twice daily.       Relevant Orders   EXERCISE TOLERANCE TEST (ETT)     Other   Hyperlipidemia   Suboptimal on last lipid panel from 07/06/2023. She prefers not to take any statins or Zetia due to side effects previously when she tried these medications.  She has pending visit with her PCP and prefers to have blood work done through PCPs office at annual physical. She tells me currently she is not interested in considering Repatha or Praluent. She is familiar with Repatha, helping her husband to take his medications in the past.  She currently wishes to hold off on any lipid-lowering therapy and wants to address this further after visit with her PCP and lab work.      Return to clinic tentatively in 6 months.   History of Present Illness:    Natalie Mack is a 79 y.o. female who is being seen today for follow up visit. PCP is Natalie Marcellus RAMAN, MD. Last visit with me in the office was 12/09/2023.  Pleasant woman here for the visit by herself. Lives with her husband at home. With his vision getting impaired she is the one driving the car.  Keeps herself busy with household activities.    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    Here for the visit by herself.  Lives with her husband at home.  Mentions overall doing well.  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  Has been avoiding walking on the treadmill for the past month.  Denies any chest pain, shortness of breath, orthopnea paroxysmal nocturnal dyspnea. Has occasional lightheadedness and sudden change in position but no sustained symptoms, denies any associated falls or syncopal episodes.  No pedal edema. No significant weight changes.  Consistently takes her medications as prescribed.  Currently not on any lipid-lowering therapy and prefers not to take any.  Home blood pressure log reviewed shows blood pressure systolic consistently around 140s and below.  Occasionally higher blood pressures.  She also notes blood pressures to drop once change of position.  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   Hyperlipidemia 02/01/2017   Hypokalemia 02/01/2017   Kidney problem 10/15/2022   Multiple thyroid  nodules 09/17/2022   Murmur 01/27/2017   Palpitation 09/19/2016   Overview:  Overview:  Rapid HR, not documented   Personal history of malignant neoplasm of breast 01/30/2015   Pulmonary nodule 09/17/2022   Statin intolerance 02/01/2017    Past Surgical History:  Procedure Laterality Date   ABDOMINAL HYSTERECTOMY     APPENDECTOMY     BREAST LUMPECTOMY Left 01/30/2015   CESAREAN SECTION     CHOLECYSTECTOMY     MOHS SURGERY  02/2017   THYROID  SURGERY     TONSILLECTOMY      Current Medications: Active Medications[1]   Allergies:   Ciprofloxacin, Nitrofuran derivatives, Sulfa antibiotics, Potassium chloride, Hydralazine , Hydrochlorothiazide , Oxycodone, and Valsartan    Social History   Socioeconomic History   Marital status: Married    Spouse name: Not on file   Number of children: Not on file   Years of education: Not on file   Highest education level: Not on file  Occupational History   Not on file  Tobacco Use   Smoking status: Never   Smokeless tobacco: Never  Vaping Use  Vaping status: Never Used  Substance and Sexual Activity   Alcohol use: No   Drug use: No   Sexual activity: Not on file  Other Topics Concern   Not on file  Social History Narrative   Not on file   Social Drivers of Health   Tobacco Use: Low Risk (06/09/2024)   Patient History    Smoking Tobacco Use: Never    Smokeless Tobacco Use: Never    Passive Exposure: Not on file  Financial Resource Strain: Not on file  Food Insecurity: Low Risk (02/08/2024)   Received from [facility]   Epic    Within the past 12 months, you worried that your food would run out before you got money to buy more: Never  true    Within the past 12 months, the food you bought just didn't last and you didn't have money to get more. : Never true  Transportation Needs: No Transportation Needs (02/08/2024)   Received from [facility]    In the past 12 months, has lack of reliable transportation kept you from medical appointments, meetings, work or from getting things needed for daily living? : No  Physical Activity: Not on file  Stress: Not on file  Social Connections: Not on file  Depression (EYV7-0): Not on file  Alcohol Screen: Not on file  Housing: Low Risk (02/08/2024)   Received from [facility]   Epic    What is your living situation today?: I have a steady place to live    Think about the place you live. Do you have problems with any of the following? Choose all that apply:: None/None on this list  Utilities: Low Risk (02/08/2024)   Received from [facility]   Utilities    In the past 12 months has the electric, gas, oil, or water company threatened to shut off services in your home? : No  Health Literacy: Not on file     Family History: The patient's family history includes Breast cancer (age of onset: 79 - 37) in her paternal aunt; Heart attack in her maternal grandmother and mother; Heart failure in her mother; Hypertension in her father. ROS:   Please see the history of present illness.    All 14 point review of systems negative except as described per history of present illness.  EKGs/Labs/Other Studies Reviewed:    The following studies were reviewed today:   EKG:       Recent Labs: 09/24/2023: ALT 13; BUN 16; Creatinine 0.75; Hemoglobin 13.9; Platelet Count 197; Potassium 4.1; Sodium 134  Recent Lipid Panel    Component Value Date/Time   CHOL 242 (H) 01/13/2021 1335   TRIG 166 (H) 01/13/2021 1335   HDL 53 01/13/2021 1335   CHOLHDL 4.6 (H) 01/13/2021 1335   LDLCALC 159 (H) 01/13/2021 1335    Physical Exam:    VS:  BP (!) 176/98   Pulse 72   Ht 5' 7 (1.702  m)   Wt 153 lb 6 oz (69.6 kg)   SpO2 97%   BMI 24.02 kg/m     Wt Readings from Last 3 Encounters:  06/09/24 153 lb 6 oz (69.6 kg)  12/16/23 149 lb 6.4 oz (67.8 kg)  12/09/23 147 lb 12.8 oz (67 kg)     GENERAL:  Well nourished, well developed in no acute distress NECK: No JVD; No carotid bruits CARDIAC: RRR, S1 and S2 present, no murmurs, no rubs, no gallops CHEST:  Clear to auscultation  without rales, wheezing or rhonchi  Extremities: No pitting pedal edema. Pulses bilaterally symmetric with radial 2+ and dorsalis pedis 2+ NEUROLOGIC:  Alert and oriented x 3  Medication Adjustments/Labs and Tests Ordered: Current medicines are reviewed at length with the patient today.  Concerns regarding medicines are outlined above.  Orders Placed This Encounter  Procedures   EXERCISE TOLERANCE TEST (ETT)   No orders of the defined types were placed in this encounter.   Signed, Zac Torti reddy Yvonnia Tango, MD, MPH, [facility]. 06/09/2024 10:17 AM    [facility]    [1]  Current Meds  Medication Sig   carvedilol  (COREG ) 25 MG tablet Take 1 tablet (25 mg total) by mouth in the morning and at bedtime.   Cholecalciferol (VITAMIN D3) 50 MCG (2000 UT) CAPS Take 2,000 Units by mouth daily.   nitroGLYCERIN  (NITROSTAT ) 0.4 MG SL tablet Place 1 tablet (0.4 mg total) under the tongue every 5 (five) minutes as needed for chest pain.   pantoprazole (PROTONIX) 40 MG tablet Take 40 mg by mouth as needed. Only taking one in the morning,   telmisartan  (MICARDIS ) 80 MG tablet TAKE 1 TABLET BY MOUTH EVERY DAY   trimethoprim (TRIMPEX) 100 MG tablet Take 100 mg by mouth at bedtime.   [DISCONTINUED] famotidine (PEPCID) 20 MG tablet Take 40 mg by mouth as needed.   "

## 2024-06-09 NOTE — Assessment & Plan Note (Signed)
 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   ongoing complaints of chest pain cardiac CT performed 10/22/2022 noted minimal nonobstructive coronary atherosclerosis with calcium score 1.8 and CAD RADS 1 study and aortic atherosclerosis, incidental finding of 5 mm right upper lobe pulmonary nodule follow-up CT chest May 2025 noted stable lesions.  Good functional status. Reported mild atypical chest sensation while walking on the track or on treadmill.  Will reassess with treadmill EKG stress test to objectively assess her heart rate response, blood pressure response and rule out any significant ischemia.  Continue her current medication carvedilol  25 mg twice daily.

## 2024-06-14 LAB — LAB REPORT - SCANNED: A1c: 5.9

## 2024-06-30 ENCOUNTER — Ambulatory Visit

## 2024-09-22 ENCOUNTER — Ambulatory Visit: Admitting: Oncology
